# Patient Record
Sex: Male | Born: 1945 | Race: Black or African American | Hispanic: No | Marital: Single | Smoking: Never smoker
Health system: Southern US, Community
[De-identification: ages and names within clinical notes are randomized; demographics above are authoritative.]

## PROBLEM LIST (undated history)

## (undated) DIAGNOSIS — R079 Chest pain, unspecified: Secondary | ICD-10-CM

## (undated) DIAGNOSIS — E119 Type 2 diabetes mellitus without complications: Secondary | ICD-10-CM

## (undated) DIAGNOSIS — E785 Hyperlipidemia, unspecified: Secondary | ICD-10-CM

## (undated) DIAGNOSIS — I2510 Atherosclerotic heart disease of native coronary artery without angina pectoris: Secondary | ICD-10-CM

## (undated) DIAGNOSIS — I10 Essential (primary) hypertension: Secondary | ICD-10-CM

## (undated) DIAGNOSIS — G20 Parkinson's disease: Secondary | ICD-10-CM

## (undated) DIAGNOSIS — G20A1 Parkinson's disease without dyskinesia, without mention of fluctuations: Secondary | ICD-10-CM

## (undated) HISTORY — DX: Essential (primary) hypertension: I10

## (undated) HISTORY — DX: Atherosclerotic heart disease of native coronary artery without angina pectoris: I25.10

## (undated) HISTORY — DX: Parkinson's disease without dyskinesia, without mention of fluctuations: G20.A1

## (undated) HISTORY — PX: CORONARY ANGIOPLASTY WITH STENT PLACEMENT: SHX49

## (undated) HISTORY — DX: Parkinson's disease: G20

## (undated) HISTORY — DX: Type 2 diabetes mellitus without complications: E11.9

## (undated) HISTORY — DX: Hyperlipidemia, unspecified: E78.5

---

## 2015-06-28 ENCOUNTER — Encounter: Payer: Self-pay | Admitting: Neurology

## 2015-06-28 ENCOUNTER — Telehealth: Payer: Self-pay | Admitting: Neurology

## 2015-06-28 ENCOUNTER — Ambulatory Visit (INDEPENDENT_AMBULATORY_CARE_PROVIDER_SITE_OTHER): Payer: Federal, State, Local not specified - PPO | Admitting: Neurology

## 2015-06-28 VITALS — BP 140/68 | HR 84 | Ht 75.0 in | Wt 207.0 lb

## 2015-06-28 DIAGNOSIS — E1342 Other specified diabetes mellitus with diabetic polyneuropathy: Secondary | ICD-10-CM | POA: Diagnosis not present

## 2015-06-28 DIAGNOSIS — R292 Abnormal reflex: Secondary | ICD-10-CM

## 2015-06-28 DIAGNOSIS — E1142 Type 2 diabetes mellitus with diabetic polyneuropathy: Secondary | ICD-10-CM

## 2015-06-28 DIAGNOSIS — G629 Polyneuropathy, unspecified: Secondary | ICD-10-CM

## 2015-06-28 DIAGNOSIS — G20 Parkinson's disease: Secondary | ICD-10-CM

## 2015-06-28 DIAGNOSIS — K5901 Slow transit constipation: Secondary | ICD-10-CM

## 2015-06-28 DIAGNOSIS — F0280 Dementia in other diseases classified elsewhere without behavioral disturbance: Secondary | ICD-10-CM

## 2015-06-28 MED ORDER — CARBIDOPA-LEVODOPA ER 50-200 MG PO TBCR: 1.0000 | EXTENDED_RELEASE_TABLET | Freq: Every day | ORAL | Status: DC

## 2015-06-28 NOTE — Telephone Encounter (Signed)
Referral to [facility] Parkinson's [facility] PT/ST faxed to [phone] with confirmation received. They will contact patient.  

## 2015-06-28 NOTE — Progress Notes (Signed)
Gabriel Ramirez was seen today in the movement disorders clinic for neurologic consultation at the request of Lavella Hammock, MD.   The patient presents today to discuss his parkinsonism.  No one accompanies him to the visit (family is in the waiting room but he refused to let them back to the appointment).   Limited prior neurology records are available to me and the faxed copy that is available to me is virtually unreadable.  He was previously taken care of in [place].  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  Pt states that he thinks that he was dx in 2012.  He states that his first symptom was that he was "slowing down" and getting "lightheaded."  Over time, he has had some right hand tremor (rarely); he is right hand dominant.  He is on carbidopa/levodopa 25/100, 2 po tid; states that it was increased in May but cannot remember what he was taking before the increase.  He has trouble telling me if the increase helped.  He takes the medication at 9:30 am/4pm/8-9pm (goes to bed about 11 pm).  He admits that he is sleeping a lot during the day.    Specific Symptoms:  Tremor: Yes.   Voice: hypophonic Sleep: states that takes gabapentin for sleep  Vivid Dreams:  No.  Acting out dreams: "I may call out" Wet Pillows: No. Postural symptoms:  Yes.   , last PT about 1 year ago  Falls?  No. Bradykinesia symptoms: slow movements and difficulty getting out of a chair Loss of smell:  No. Loss of taste:  No. Urinary Incontinence:  No.  Constipation: yes Difficulty Swallowing:  Yes.   (only with certain pills and does well with chin tuck) Handwriting, micrographia: Yes.   Trouble with ADL's:  Yes.    Trouble buttoning clothing: Yes.   Depression:  Yes.   Memory changes:  Yes.  (doesn't drive since moved here in July) Hallucinations:  No.  visual distortions: No. N/V:  No. Lightheaded:  Yes.    Syncope:  No. Diplopia:  No. Dyskinesia:  No.  Neuroimaging has previously been performed.  It is not available for my review today.  PREVIOUS MEDICATIONS: Sinemet  ALLERGIES:  No Known Allergies  CURRENT MEDICATIONS:  Outpatient Encounter Prescriptions as of 06/28/2015  Medication Sig  . amLODipine (NORVASC) 10 MG tablet Take 10 mg by mouth daily.  Marland Kitchen atorvastatin (LIPITOR) 20 MG tablet Take 20 mg by mouth daily.  . carbidopa-levodopa (SINEMET IR) 25-100 MG tablet TAKE 2 TABLETS BY MOUTH 3 TIMES DAILY  . gabapentin (NEURONTIN) 300 MG capsule TAKE 2 CAPSULES BY MOUTH EVERY DAY AT BEDTIME  . JANUVIA 50 MG tablet Take 50 mg by mouth daily.  . quinapril (ACCUPRIL) 20 MG tablet Take 20 mg by mouth daily.   No facility-administered encounter medications on file as of 06/28/2015.    PAST MEDICAL HISTORY:   Past Medical History  Diagnosis Date  . Parkinson's disease   . Hypertension   . Diabetes     PAST SURGICAL HISTORY:   Past Surgical History  Procedure Laterality Date  . Coronary angioplasty with stent placement      SOCIAL HISTORY:   Social History   Social History  . Marital Status: Unknown    Spouse Name: N/A  . Number of Children: N/A  . Years of Education: N/A   Occupational History  . Not on file.  Social History Main Topics  . Smoking status: Never Smoker   . Smokeless tobacco: Not on file  . Alcohol Use: No  . Drug Use: No  . Sexual Activity: Not on file   Other Topics Concern  . Not on file   Social History Narrative  . No narrative on file    FAMILY HISTORY:   Family Status  Relation Status Death Age  . Mother Deceased     alzheimer's  . Father Deceased     HTN  . Sister Deceased     x3    ROS:  A complete 10 system review of systems was obtained and was unremarkable apart from what is mentioned above.  PHYSICAL EXAMINATION:    VITALS:   Filed Vitals:   06/28/15 1421  BP: 140/68  Pulse: 84  Height:  (1.905 m)  Weight: 207 lb (93.895  kg)    GEN:  The patient appears stated age and is in NAD. HEENT:  Normocephalic, atraumatic.  The mucous membranes are moist. The superficial temporal arteries are without ropiness or tenderness. CV:  RRR Lungs:  CTAB Neck/HEME:  There are no carotid bruits bilaterally.  Neurological examination:  Orientation:  Montreal Cognitive Assessment  06/28/2015  Visuospatial/ Executive (0/5) 1  Naming (0/3) 2  Attention: Read list of digits (0/2) 1  Attention: Read list of letters (0/1) 1  Attention: Serial 7 subtraction starting at 100 (0/3) 3  Language: Repeat phrase (0/2) 2  Language : Fluency (0/1) 1  Abstraction (0/2) 2  Delayed Recall (0/5) 0  Orientation (0/6) 5  Total 18  Adjusted Score (based on education) 19   Cranial nerves: There is good facial symmetry. There is facial hypomimia.  Pupils are equal round and reactive to light bilaterally. Fundoscopic exam reveals clear margins bilaterally. Extraocular muscles are intact. There is poor smooth pursuit.  The visual fields are full to confrontational testing. The speech is fluent and clear.  He is very hypophonic.   Soft palate rises symmetrically and there is no tongue deviation. Hearing is intact to conversational tone. Sensation: Sensation is intact to light and pinprick throughout (facial, trunk, extremities). Vibration is markedly decreased distally. There is no extinction with double simultaneous stimulation. There is no sensory dermatomal level identified. Motor: Strength is 5/5 in the bilateral upper and lower extremities.   Shoulder shrug is equal and symmetric.  There is no pronator drift. Deep tendon reflexes: Deep tendon reflexes are 2+/4 at the bilateral biceps, triceps, brachioradialis, patella and 2/4 at the bilateral achilles. Plantar responses are downgoing bilaterally.  Movement examination: Tone: There is marked increased tone in the RUE/RLE and mild-mod increased tone in LUE. Abnormal movements: none even with  distraction Coordination:  There is decremation with RAM's, with all form of RAMS, including alternating supination and pronation of the forearm, hand opening and closing, finger taps, heel taps and toe taps, right more than left and UE more than LE. Gait and Station: The patient has difficulty arising out of a deep-seated chair without the use of the hands; in fact, he cannot do it without pushing off. The patient's stride length is decreased with decreased arm swing bilaterally.  The patient has a negative pull test.      ASSESSMENT/PLAN:  1.  Parkinsonism.  I suspect that this does represent idiopathic Parkinson's disease.  The patient has tremor, bradykinesia, rigidity and postural instability.  -We discussed the diagnosis as well as pathophysiology of the disease.  We discussed treatment options  as well as prognostic indicators.  Patient education was provided.  -discussed with him that he does NOT have lewy body disease and that lewy body disease is a separate disease from PD even though we see "lewy bodies" pathologically in both diseases  -He looks undertreated, but has not taken his medications since 9 AM this morning and was seen at 2:30 PM.  While I asked him to continue his carbidopa/levodopa 25/100, 2 tablets 3 times per day I also asked him to move the dosages closer together so that he was taking 2 tablets at 9 AM/1 PM/5-6:00 PM and then we will add carbidopa/levodopa 50/200 CR at bedtime  -I will refer the patient to the Parkinson's program at [facility] for physical and speech therapy  -We discussed community resources in the area including patient support groups and community exercise programs for PD and pt education was provided to the patient.  -We talked about the importance of safe, cardiovascular exercise. 2.  Parkinson's disease dementia  -Overall, this is fairly mild.  He asked me about driving and while I am concerned about memory, I really am more concerned about the slowing  of reflexes.  He wants to start driving to the store, but I told him that I would recommend a medical driving evaluation before he did this.  I gave him the number to an independent medical evaluator and he can call to set this up at his convenience 3.  Constipation  -This is frequently associated with Parkinson's disease.  I did give him a copy of the rancho recipe. 4.  Hyperreflexia  -He is mildly hyperreflexic, which is somewhat surprising given diabetic peripheral neuropathy.  However, he denies any neck pain and reports that he has had multiple previous scans of the brain.  I will try to get a copy of prior records from [place] from his previous neurologist.  There is nothing focal or lateralizing on his examination. 5.  Diabetic peripheral neuropathy  -Safety was discussed.  -The patient is on gabapentin, 600 mg at night.  He tells me that this is primarily for sleep.  We decided not to change this today, since we are changing other things.  We may wean that in the future. 6.  Follow-up in the next 2-3 months, sooner should new neurologic issues arise.  Greater than 50% of the 60 minute visit spent in counseling.

## 2015-06-28 NOTE — Patient Instructions (Addendum)
1. Constipation and Parkinson's disease:   1.Rancho recipe for constipation in Parkinsons Disease:  -1 cup of bran, 2 cups of applesauce in 1 cup of prune juice  2.  Increase fiber intake (Metamucil,vegetables)  3.  Regular, moderate exercise can be beneficial.  4.  Avoid medications causing constipation, such as medications like antacids with calcium or magnesium  5.  Laxative overuse should be avoided.  6.  Stool softeners (Colace) can help with chronic constipation.  2. If you are interested in the driving assessment, you can contact [employer] in [place]. [phone]. 3. Continue Gabapentin 4. We have referred you to [facility] for physical and speech therapy. They will contact you directly to set up care. They can be reached at [phone]. 5. Move your Carbidopa Levodopa IR doses to 9 am/ 1 pm/ 5 pm and start Carbidopa Levodopa 50/200 at night. This prescription has been sent to your pharmacy.  6. Follow up in 8 weeks.

## 2015-06-29 NOTE — Progress Notes (Signed)
Note routed to Dr Mitchell.  

## 2015-08-30 ENCOUNTER — Encounter: Payer: Self-pay | Admitting: Neurology

## 2015-08-30 ENCOUNTER — Ambulatory Visit (INDEPENDENT_AMBULATORY_CARE_PROVIDER_SITE_OTHER): Payer: Federal, State, Local not specified - PPO | Admitting: Neurology

## 2015-08-30 VITALS — BP 138/84 | HR 80 | Ht 75.0 in | Wt 213.0 lb

## 2015-08-30 DIAGNOSIS — G20 Parkinson's disease: Secondary | ICD-10-CM | POA: Diagnosis not present

## 2015-08-30 DIAGNOSIS — F0280 Dementia in other diseases classified elsewhere without behavioral disturbance: Secondary | ICD-10-CM

## 2015-08-30 DIAGNOSIS — R55 Syncope and collapse: Secondary | ICD-10-CM | POA: Diagnosis not present

## 2015-08-30 DIAGNOSIS — Z955 Presence of coronary angioplasty implant and graft: Secondary | ICD-10-CM

## 2015-08-30 MED ORDER — CARBIDOPA-LEVODOPA 25-100 MG PO TABS: ORAL_TABLET | ORAL | Status: DC

## 2015-08-30 NOTE — Progress Notes (Signed)
Gabriel Ramirez was seen today in the movement disorders clinic for neurologic consultation at the request of Lavella HammockMitchell, Kyle Darren, MD.   The patient presents today to discuss his parkinsonism.  No one accompanies him to the visit (family is in the waiting room but he refused to let them back to the appointment).   Limited prior neurology records are available to me and the faxed copy that is available to me is virtually unreadable.  He was previously taken care of in [place].  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  Pt states that he thinks that he was dx in 2012.  He states that his first symptom was that he was "slowing down" and getting "lightheaded."  Over time, he has had some right hand tremor (rarely); he is right hand dominant.  He is on carbidopa/levodopa 25/100, 2 po tid; states that it was increased in May but cannot remember what he was taking before the increase.  He has trouble telling me if the increase helped.  He takes the medication at 9:30 am/4pm/8-9pm (goes to bed about 11 pm).  He admits that he is sleeping a lot during the day.    08/30/15 update:  The patient returns today for follow-up.  He is on carbidopa/levodopa 25/100, 2 tablets 3 times per day (9 AM/1 PM/6 p.m.) and last visit we added carbidopa/levodopa 50/200 at night.  He states that it helped morning stiffness.  I referred [facility] for [facility] speech therapy.   The therapy has helped.  Since our last visit, the patient denies any falls.  He denies hallucinations.    I was able to get a copy of his MRI of the brain since our last visit, but it was a very difficult to read fax.  I am unsure if it was dated 12/17/2013 or 12/18/2014.  Regardless, the report stated that it showed chronic microvascular changes and an old lacune in the right frontal region.  I have tried multiple times to get records from [place] and no records were sent.   He has not been exercising.  He initially reports headache, but upon further questioning he actually has no head pain at all and is trying to tell me that he is having near syncopal episodes after he eats lunch every day.  He tells me that this is the same feeling that he had before he had to have his cardiac stent placed.  He had a cardiologist when he lived in [place], but has not yet established here.  He denies any chest pain.  He states that he does not think that the episodes are associated with taking his noon medication as he had these episodes years ago before he started taking levodopa.  He also c/o constipation.  Neuroimaging has previously been performed.  It is not available for my review today.  PREVIOUS MEDICATIONS: Sinemet  ALLERGIES:  No Known Allergies  CURRENT MEDICATIONS:  Outpatient Encounter Prescriptions as of 08/30/2015  Medication Sig  . amLODipine (NORVASC) 10 MG tablet Take 10 mg by mouth daily.  Marland Kitchen. atorvastatin (LIPITOR) 20 MG tablet Take 20 mg by mouth daily.  . carbidopa-levodopa (SINEMET CR) 50-200 MG tablet Take 1 tablet by mouth at bedtime.  . carbidopa-levodopa (SINEMET IR) 25-100 MG tablet TAKE 2 TABLETS BY MOUTH 3 TIMES DAILY  . gabapentin (NEURONTIN) 300 MG capsule TAKE 2 CAPSULES BY MOUTH EVERY DAY  AT BEDTIME  . JANUVIA 50 MG tablet Take 50 mg by mouth daily.  . quinapril (ACCUPRIL) 20 MG tablet Take 20 mg by mouth daily.  Marland Kitchen zolpidem (AMBIEN) 10 MG tablet Take 10 mg by mouth at bedtime as needed for sleep.   No facility-administered encounter medications on file as of 08/30/2015.    PAST MEDICAL HISTORY:   Past Medical History  Diagnosis Date  . Parkinson's disease   . Hypertension   . Diabetes     PAST SURGICAL HISTORY:   Past Surgical History  Procedure Laterality Date  . Coronary angioplasty with stent placement      SOCIAL HISTORY:   Social History   Social History  . Marital Status: Unknown    Spouse Name: N/A  . Number of Children:  N/A  . Years of Education: N/A   Occupational History  . Not on file.   Social History Main Topics  . Smoking status: Never Smoker   . Smokeless tobacco: Not on file  . Alcohol Use: No  . Drug Use: No  . Sexual Activity: Not on file   Other Topics Concern  . Not on file   Social History Narrative  . No narrative on file    FAMILY HISTORY:   Family Status  Relation Status Death Age  . Mother Deceased     alzheimer's  . Father Deceased     HTN  . Sister Deceased     x3    ROS:  A complete 10 system review of systems was obtained and was unremarkable apart from what is mentioned above.  PHYSICAL EXAMINATION:    VITALS:   Filed Vitals:   08/30/15 1454  BP: 138/84  Pulse: 80  Height:  (1.905 m)  Weight: 213 lb (96.616 kg)    GEN:  The patient appears stated age and is in NAD. HEENT:  Normocephalic, atraumatic.  The mucous membranes are moist. The superficial temporal arteries are without ropiness or tenderness. CV:  RRR Lungs:  CTAB Neck/HEME:  There are no carotid bruits bilaterally.  Neurological examination:  Orientation:  Montreal Cognitive Assessment  06/28/2015  Visuospatial/ Executive (0/5) 1  Naming (0/3) 2  Attention: Read list of digits (0/2) 1  Attention: Read list of letters (0/1) 1  Attention: Serial 7 subtraction starting at 100 (0/3) 3  Language: Repeat phrase (0/2) 2  Language : Fluency (0/1) 1  Abstraction (0/2) 2  Delayed Recall (0/5) 0  Orientation (0/6) 5  Total 18  Adjusted Score (based on education) 19   Cranial nerves: There is good facial symmetry. There is facial hypomimia.  Pupils are equal round and reactive to light bilaterally. Fundoscopic exam reveals clear margins bilaterally. Extraocular muscles are intact. There is poor smooth pursuit.  The visual fields are full to confrontational testing. The speech is fluent and clear.  He is very hypophonic.   Soft palate rises symmetrically and there is no tongue deviation.  Hearing is intact to conversational tone. Sensation: Sensation is intact to light touch throughout. Motor: Strength is 5/5 in the bilateral upper and lower extremities.   Shoulder shrug is equal and symmetric.  There is no pronator drift.   Movement examination: Tone: There is moderate increased tone bilaterally today. Abnormal movements: none even with distraction Coordination:  There is decremation with RAM's, with all form of RAMS, including alternating supination and pronation of the forearm, hand opening and closing, finger taps, heel taps and toe taps, right more than left and  UE more than LE. Gait and Station: The patient has difficulty arising out of a deep-seated chair without the use of the hands; in fact, he cannot do it without pushing off. The patient has start hesitation.  The patient has a negative pull test.      ASSESSMENT/PLAN:  1.  Parkinsonism.  I suspect that this does represent idiopathic Parkinson's disease.  The patient has tremor, bradykinesia, rigidity and postural instability.  -I am going to increase his carbidopa/levodopa 25/100, 2 tablets at 9 AM/noon/3 PM and one tablet at 7 PM.  He will add carbidopa/levodopa 50/200 at bedtime.  I am hoping that he is able to manage these medications by himself, as he tells me that he is administering them to himself. 2.  Parkinson's disease dementia  -He is living at assisted living.  Again, there is some concern on my part about management of medication, although he did seem to know his medications today and brought them in with him today. 3.  Constipation  -This is frequently associated with Parkinson's disease.  I did give him a copy of the rancho recipe again today. 4.  Near syncope  -Although he had trouble initially describing this, and actually called this headache until I figured out that he actually had no head pain and ultimately he was able to tell me that this was the same feeling he had before he had his cardiac stent  placed.  I will refer him to cardiology. 5.  Diabetic peripheral neuropathy  -Safety was discussed.  -The patient is on gabapentin, 600 mg at night.  He tells me that this is primarily for sleep.  We decided not to change this today, since we are changing other things.  We may wean that in the future. 6.  Follow-up in the next 2-3 months, sooner should new neurologic issues arise.

## 2015-08-30 NOTE — Patient Instructions (Addendum)
Constipation and Parkinson's disease:   1.Rancho recipe for constipation in Parkinsons Disease:  -1 cup of bran (get this at [employer], [employer] or other natural food store), 2 cups of applesauce in 1 cup of prune juice  2.  Increase fiber intake (Metamucil,vegetables)  3.  Regular, moderate exercise can be beneficial.  4.  Avoid medications causing constipation, such as medications like antacids with calcium or magnesium  5.  Laxative overuse should be avoided.  6.  Stool softeners (Colace) can help with chronic constipation.  Change Carbidopa Levodopa 25/100 to 2 tablets at 9 am. 2 tablets at 12 pm. 2 tablets at 3 pm. 1 tablet at 7 pm. A new prescription has been sent. Continue Carbidopa Levodopa 50/200 - 1 tablet at bedtime.   Cardiology appt scheduled with Dr. Jens Somrenshaw 08/31/2015 at 11:15 am.

## 2015-08-31 ENCOUNTER — Encounter: Payer: Self-pay | Admitting: Cardiology

## 2015-08-31 ENCOUNTER — Ambulatory Visit (INDEPENDENT_AMBULATORY_CARE_PROVIDER_SITE_OTHER): Payer: Federal, State, Local not specified - PPO | Admitting: Cardiology

## 2015-08-31 VITALS — BP 134/66 | HR 80 | Ht 75.0 in | Wt 213.6 lb

## 2015-08-31 DIAGNOSIS — E785 Hyperlipidemia, unspecified: Secondary | ICD-10-CM

## 2015-08-31 DIAGNOSIS — G20A1 Parkinson's disease without dyskinesia, without mention of fluctuations: Secondary | ICD-10-CM

## 2015-08-31 DIAGNOSIS — I2583 Coronary atherosclerosis due to lipid rich plaque: Secondary | ICD-10-CM

## 2015-08-31 DIAGNOSIS — I10 Essential (primary) hypertension: Secondary | ICD-10-CM

## 2015-08-31 DIAGNOSIS — G20 Parkinson's disease: Secondary | ICD-10-CM | POA: Diagnosis not present

## 2015-08-31 DIAGNOSIS — I2510 Atherosclerotic heart disease of native coronary artery without angina pectoris: Secondary | ICD-10-CM

## 2015-08-31 NOTE — Assessment & Plan Note (Signed)
Blood pressure controlled. Continue present medications. 

## 2015-08-31 NOTE — Assessment & Plan Note (Signed)
Continue statin. 

## 2015-08-31 NOTE — Assessment & Plan Note (Signed)
Continue aspirin and statin. We will obtain previous records from [place] concerning his previous PCI.

## 2015-08-31 NOTE — Assessment & Plan Note (Signed)
Management per neurology. Note he occasionally has orthostatic symptoms that are not particularly limiting. If they worsen we will decrease his blood pressure medications in the future.

## 2015-08-31 NOTE — Progress Notes (Signed)
Note routed to Dr Clovis RileyMitchell.

## 2015-08-31 NOTE — Progress Notes (Signed)
     HPI: 69 year old male for evaluation of CAD. Patient previously lived in [place]. He has had previous PCI of his first diagonal. Full records are not available. He recently moved to this area to be with family. He denies significant dyspnea, chest pain, palpitations, pedal edema or syncope. He occasionally has some dizziness with standing. Note he also has Parkinson's.  Current Outpatient Prescriptions  Medication Sig Dispense Refill  . amLODipine (NORVASC) 10 MG tablet Take 10 mg by mouth daily.  3  . atorvastatin (LIPITOR) 20 MG tablet Take 20 mg by mouth daily.  3  . carbidopa-levodopa (SINEMET CR) 50-200 MG tablet Take 1 tablet by mouth at bedtime. 90 tablet 1  . carbidopa-levodopa (SINEMET IR) 25-100 MG tablet Take 2 tablets at 9 am. 2 tablets at 12 pm. 2 tablets at 3 pm. 1 tablet at 7 pm. 210 tablet 5  . gabapentin (NEURONTIN) 300 MG capsule TAKE 2 CAPSULES BY MOUTH EVERY DAY AT BEDTIME  3  . JANUVIA 50 MG tablet Take 50 mg by mouth daily.  3  . quinapril (ACCUPRIL) 20 MG tablet Take 20 mg by mouth daily.  3  . zolpidem (AMBIEN) 10 MG tablet Take 10 mg by mouth at bedtime as needed for sleep.     No current facility-administered medications for this visit.    No Known Allergies   Past Medical History  Diagnosis Date  . Parkinson's disease (HCC)   . Hypertension   . Diabetes (HCC)   . Hyperlipidemia   . CAD (coronary artery disease)     Stent in [place]; First diagonal    Past Surgical History  Procedure Laterality Date  . Coronary angioplasty with stent placement      Social History   Social History  . Marital Status: Single    Spouse Name: N/A  . Number of Children: N/A  . Years of Education: N/A   Occupational History  . Not on file.   Social History Main Topics  . Smoking status: Never Smoker   . Smokeless tobacco: Not on file  . Alcohol Use: No  . Drug Use: No  . Sexual Activity: Not on file   Other Topics Concern  . Not on file    Social History Narrative    Family History  Problem Relation Age of Onset  . Alzheimer's disease Mother   . Hypertension Father     ROS: no fevers or chills, productive cough, hemoptysis, dysphasia, odynophagia, melena, hematochezia, dysuria, hematuria, rash, seizure activity, orthopnea, PND, pedal edema, claudication. Remaining systems are negative.  Physical Exam:   Blood pressure 134/66, pulse 80, height 6\' 3"  (1.905 m), weight 213 lb 9.6 oz (96.888 kg).  General:  Well developed/well nourished in NAD Skin warm/dry Patient not depressed No peripheral clubbing Back-normal HEENT-normal/normal eyelids Neck supple/normal carotid upstroke bilaterally; no bruits; no JVD; no thyromegaly chest - CTA/ normal expansion CV - RRR/normal S1 and S2; no murmurs, rubs or gallops;  PMI nondisplaced Abdomen -NT/ND, no HSM, no mass, + bowel sounds, no bruit 2+ femoral pulses, no bruits Ext-no edema, chords, 2+ DP Neuro-flat affect; consistent with Parkinson's  ECG Sinus rhythm at a rate of 80. Normal axis. Nonspecific T-wave changes.

## 2015-08-31 NOTE — Patient Instructions (Signed)
Your physician wants you to follow-up in: 6 MONTHS WITH DR CRENSHAW You will receive a reminder letter in the mail two months in advance. If you don't receive a letter, please call our office to schedule the follow-up appointment.   If you need a refill on your cardiac medications before your next appointment, please call your pharmacy.  

## 2015-10-25 ENCOUNTER — Encounter: Payer: Self-pay | Admitting: Neurology

## 2015-10-25 ENCOUNTER — Ambulatory Visit (INDEPENDENT_AMBULATORY_CARE_PROVIDER_SITE_OTHER): Payer: Federal, State, Local not specified - PPO | Admitting: Neurology

## 2015-10-25 VITALS — BP 100/58 | HR 68 | Ht 75.0 in | Wt 212.0 lb

## 2015-10-25 DIAGNOSIS — F0280 Dementia in other diseases classified elsewhere without behavioral disturbance: Secondary | ICD-10-CM

## 2015-10-25 DIAGNOSIS — R55 Syncope and collapse: Secondary | ICD-10-CM

## 2015-10-25 DIAGNOSIS — G20 Parkinson's disease: Secondary | ICD-10-CM | POA: Diagnosis not present

## 2015-10-25 NOTE — Progress Notes (Signed)
Gabriel Ramirez was seen today in the movement disorders clinic for neurologic consultation at the request of Lavella Hammock, MD.   The patient presents today to discuss his parkinsonism.  No one accompanies him to the visit (family is in the waiting room but he refused to let them back to the appointment).   Limited prior neurology records are available to me and the faxed copy that is available to me is virtually unreadable.  He was previously taken care of in [place].  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  Pt states that he thinks that he was dx in 2012.  He states that his first symptom was that he was "slowing down" and getting "lightheaded."  Over time, he has had some right hand tremor (rarely); he is right hand dominant.  He is on carbidopa/levodopa 25/100, 2 po tid; states that it was increased in May but cannot remember what he was taking before the increase.  He has trouble telling me if the increase helped.  He takes the medication at 9:30 am/4pm/8-9pm (goes to bed about 11 pm).  He admits that he is sleeping a lot during the day.    08/30/15 update:  The patient returns today for follow-up.  He is on carbidopa/levodopa 25/100, 2 tablets 3 times per day (9 AM/1 PM/6 p.m.) and last visit we added carbidopa/levodopa 50/200 at night.  He states that it helped morning stiffness.  I referred him to [facility] for home physical and speech therapy.   The therapy has helped.  Since our last visit, the patient denies any falls.  He denies hallucinations.    I was able to get a copy of his MRI of the brain since our last visit, but it was a very difficult to read fax.  I am unsure if it was dated 12/17/2013 or 12/18/2014.  Regardless, the report stated that it showed chronic microvascular changes and an old lacune in the right frontal region.  I have tried multiple times to get records from [place] and no records were sent.   He has not been exercising.  He initially reports headache, but upon further questioning he actually has no head pain at all and is trying to tell me that he is having near syncopal episodes after he eats lunch every day.  He tells me that this is the same feeling that he had before he had to have his cardiac stent placed.  He had a cardiologist when he lived in [place], but has not yet established here.  He denies any chest pain.  He states that he does not think that the episodes are associated with taking his noon medication as he had these episodes years ago before he started taking levodopa.  He also c/o constipation.  10/25/15 update:  The patient is following up today.  Last visit, his levodopa was slightly increased.  He is supposed to be taking carbidopa/levodopa 25/100, 2 tablets at 9 AM, noon, 3 PM, and one tablet at 7 PM in addition to carbidopa/levodopa 50/200 at night.  He did not add that one at 7 last visit like he was supposed to.  He doesn't remember [place] talking about it.  He has seen cardiology since our last visit.  I reviewed Dr. Ludwig Clarks records.  He is taking a wait and see approach in deciding on whether or not to decrease his blood pressure medications.  The patient has described some lightheadedness, but he has not had any syncopal episodes.  He is on gabapentin, 600 mg at night for diabetic peripheral neuropathy.  He c/o nausea today.  It doesn't seem to come at any particular time.  He states that it used to be "every once in a while" but now it is more regular.      Neuroimaging has previously been performed.  It is not available for my review today.  PREVIOUS MEDICATIONS: Sinemet  ALLERGIES:  No Known Allergies  CURRENT MEDICATIONS:  Outpatient Encounter Prescriptions as of 10/25/2015  Medication Sig  . amLODipine (NORVASC) 10 MG tablet Take 10 mg by mouth daily.  Marland Kitchen atorvastatin (LIPITOR) 20 MG tablet Take 20 mg by mouth daily.  . carbidopa-levodopa (SINEMET CR) 50-200 MG  tablet Take 1 tablet by mouth at bedtime.  . carbidopa-levodopa (SINEMET IR) 25-100 MG tablet Take 2 tablets at 9 am. 2 tablets at 12 pm. 2 tablets at 3 pm. 1 tablet at 7 pm. (Patient taking differently: Take 2 tablets at 8 am. 2 tablets at 12 pm. 2 tablets at 4:30 pm.)  . gabapentin (NEURONTIN) 300 MG capsule TAKE 2 CAPSULES BY MOUTH EVERY DAY AT BEDTIME  . JANUVIA 50 MG tablet Take 50 mg by mouth daily.  . quinapril (ACCUPRIL) 20 MG tablet Take 20 mg by mouth daily.  . [DISCONTINUED] zolpidem (AMBIEN) 10 MG tablet Take 10 mg by mouth at bedtime as needed for sleep.   No facility-administered encounter medications on file as of 10/25/2015.    PAST MEDICAL HISTORY:   Past Medical History  Diagnosis Date  . Parkinson's disease (HCC)   . Hypertension   . Diabetes (HCC)   . Hyperlipidemia   . CAD (coronary artery disease)     Stent in [place]; First diagonal    PAST SURGICAL HISTORY:   Past Surgical History  Procedure Laterality Date  . Coronary angioplasty with stent placement      SOCIAL HISTORY:   Social History   Social History  . Marital Status: Single    Spouse Name: N/A  . Number of Children: N/A  . Years of Education: N/A   Occupational History  . Not on file.   Social History Main Topics  . Smoking status: Never Smoker   . Smokeless tobacco: Not on file  . Alcohol Use: No  . Drug Use: No  . Sexual Activity: Not on file   Other Topics Concern  . Not on file   Social History Narrative    FAMILY HISTORY:   Family Status  Relation Status Death Age  . Mother Deceased     alzheimer's  . Father Deceased     HTN  . Sister Deceased     x3    ROS:  A complete 10 system review of systems was obtained and was unremarkable apart from what is mentioned above.  PHYSICAL EXAMINATION:    VITALS:   Filed Vitals:   10/25/15 1408  BP: 100/58  Pulse: 68  Height:  (1.905 m)  Weight: 212 lb (96.163 kg)    GEN:  The patient appears stated age and is in  NAD. HEENT:  Normocephalic, atraumatic.  The mucous membranes are moist. The superficial temporal arteries are without ropiness or tenderness. CV:  RRR Lungs:  CTAB Neck/HEME:  There are no carotid bruits bilaterally.  Neurological examination:  Orientation:  Montreal Cognitive Assessment  06/28/2015  Visuospatial/ Executive (0/5) 1  Naming (0/3) 2  Attention: Read list of digits (0/2) 1  Attention: Read list of letters (0/1) 1  Attention: Serial 7 subtraction starting at 100 (0/3) 3  Language: Repeat phrase (0/2) 2  Language : Fluency (0/1) 1  Abstraction (0/2) 2  Delayed Recall (0/5) 0  Orientation (0/6) 5  Total 18  Adjusted Score (based on education) 19   Cranial nerves: There is good facial symmetry. There is facial hypomimia.  Pupils are equal round and reactive to light bilaterally. Fundoscopic exam reveals clear margins bilaterally. Extraocular muscles are intact. There is poor smooth pursuit.  The visual fields are full to confrontational testing. The speech is fluent and clear.  He is very hypophonic.   Soft palate rises symmetrically and there is no tongue deviation. Hearing is intact to conversational tone. Sensation: Sensation is intact to light touch throughout. Motor: Strength is 5/5 in the bilateral upper and lower extremities.   Shoulder shrug is equal and symmetric.  There is no pronator drift.   Movement examination: Tone: There is moderate increased tone bilaterally today in the UE (some gegenhalten as well).  Tone in the LE is normal Abnormal movements: mild dyskinesia, mostly in the legs Coordination:  There is decremation with RAM's, with all form of RAMS, including alternating supination and pronation of the forearm, hand opening and closing, finger taps, heel taps and toe taps, right more than left and UE more than LE. Gait and Station: The patient has difficulty arising out of a deep-seated chair without the use of the hands; in fact, he cannot do it without  pushing off. The patient has start hesitation.  He walks well with just slightly decreased arm swing on the right.    ASSESSMENT/PLAN:  1.  Parkinsonism.  I suspect that this does represent idiopathic Parkinson's disease.  The patient has tremor, bradykinesia, rigidity and postural instability.  -He is supposed to be on carbidopa/levodopa 25/100, 2 tablets at 9 AM/noon/3 PM and one tablet at 7 PM but he never took the 7pm dose and doesn't really want to change it, even though he is moderately rigid.  He will continue carbidopa/levodopa 50/200 at bedtime.   -encouraged pt to add CV exercise.  Has stationary bike where he lives.   2.  Parkinson's disease dementia  -He is living at assisted living.  I would like to see them manage meds but he is doing that for now. 3.  Constipation  -This is frequently associated with Parkinson's disease.  I did give him a copy of the rancho recipe again today. 4.  Near syncope  -seems to be doing well in this regard.   5.  Diabetic peripheral neuropathy  -Safety was discussed.  -The patient is on gabapentin, 600 mg at night.  He tells me that this is primarily for sleep.  We decided not to change this today, since we are changing other things.  We may wean that in the future. 6.  Follow-up in the next 2-3 months, sooner should new neurologic issues arise.

## 2016-01-03 ENCOUNTER — Other Ambulatory Visit: Payer: Self-pay | Admitting: Neurology

## 2016-01-04 NOTE — Telephone Encounter (Signed)
Carbidopa Levodopa 50/200 refill requested. Per last office note- patient to remain on medication. Refill approved and sent to patient's pharmacy.   

## 2016-02-26 ENCOUNTER — Ambulatory Visit: Payer: Medicare Other | Admitting: Neurology

## 2016-03-28 ENCOUNTER — Ambulatory Visit (INDEPENDENT_AMBULATORY_CARE_PROVIDER_SITE_OTHER): Payer: Federal, State, Local not specified - PPO | Admitting: Neurology

## 2016-03-28 ENCOUNTER — Encounter: Payer: Self-pay | Admitting: Neurology

## 2016-03-28 VITALS — BP 130/70 | HR 77 | Ht 75.0 in | Wt 214.4 lb

## 2016-03-28 DIAGNOSIS — E1142 Type 2 diabetes mellitus with diabetic polyneuropathy: Secondary | ICD-10-CM

## 2016-03-28 DIAGNOSIS — G44209 Tension-type headache, unspecified, not intractable: Secondary | ICD-10-CM

## 2016-03-28 DIAGNOSIS — G20 Parkinson's disease: Secondary | ICD-10-CM | POA: Diagnosis not present

## 2016-03-28 NOTE — Progress Notes (Signed)
Gabriel Ramirez was seen today in the movement disorders clinic for neurologic consultation at the request of Lavella Hammock, MD.   The patient presents today to discuss his parkinsonism.  No one accompanies him to the visit (family is in the waiting room but he refused to let them back to the appointment).   Limited prior neurology records are available to me and the faxed copy that is available to me is virtually unreadable.  He was previously taken care of in [place].  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  Pt states that he thinks that he was dx in 2012.  He states that his first symptom was that he was "slowing down" and getting "lightheaded."  Over time, he has had some right hand tremor (rarely); he is right hand dominant.  He is on carbidopa/levodopa 25/100, 2 po tid; states that it was increased in May but cannot remember what he was taking before the increase.  He has trouble telling me if the increase helped.  He takes the medication at 9:30 am/4pm/8-9pm (goes to bed about 11 pm).  He admits that he is sleeping a lot during the day.    08/30/15 update:  The patient returns today for follow-up.  He is on carbidopa/levodopa 25/100, 2 tablets 3 times per day (9 AM/1 PM/6 p.m.) and last visit we added carbidopa/levodopa 50/200 at night.  He states that it helped morning stiffness.  I referred him to [facility] for home physical and speech therapy.   The therapy has helped.  Since our last visit, the patient denies any falls.  He denies hallucinations.    I was able to get a copy of his MRI of the brain since our last visit, but it was a very difficult to read fax.  I am unsure if it was dated 12/17/2013 or 12/18/2014.  Regardless, the report stated that it showed chronic microvascular changes and an old lacune in the right frontal region.  I have tried multiple times to get records from [place] and no records were sent.   He has not been exercising.  He initially reports headache, but upon further questioning he actually has no head pain at all and is trying to tell me that he is having near syncopal episodes after he eats lunch every day.  He tells me that this is the same feeling that he had before he had to have his cardiac stent placed.  He had a cardiologist when he lived in [place], but has not yet established here.  He denies any chest pain.  He states that he does not think that the episodes are associated with taking his noon medication as he had these episodes years ago before he started taking levodopa.  He also c/o constipation.  10/25/15 update:  The patient is following up today.  Last visit, his levodopa was slightly increased.  He is supposed to be taking carbidopa/levodopa 25/100, 2 tablets at 9 AM, noon, 3 PM, and one tablet at 7 PM in addition to carbidopa/levodopa 50/200 at night.  He did not add that one at 7 last visit like he was supposed to.  He doesn't remember [place] talking about it.  He has seen cardiology since our last visit.  I reviewed Dr. Ludwig Clarks records.  He is taking a wait and see approach in deciding on whether or not to decrease his blood pressure medications.  The patient has described some lightheadedness, but he has not had any syncopal episodes.  He is on gabapentin, 600 mg at night for diabetic peripheral neuropathy.  He c/o nausea today.  It doesn't seem to come at any particular time.  He states that it used to be "every once in a while" but now it is more regular.      03/28/16 update:  The patient is following up today.  He is taking carbidopa/levodopa 25/100, 2 tablets at 9 AM, noon, 3 PM, and carbidopa/levodopa 50/200 at night.  A higher dosage has been recommended, but he really has not wanted to go up on the medication.  He is riding stationary bike 2-3 times a week.   He denies any falls since last visit.  The patient has described some lightheadedness, but he has not had any  syncopal episodes.  He is on gabapentin, 600 mg at night for diabetic peripheral neuropathy.  Having headaches, mostly facial.  Coming daily.  Cannot describe quality to me.  May take advil to relieve and does that one time a day.  Has no photophobia and no phonophobia.  Been going on about a month  Neuroimaging has previously been performed.  It is not available for my review today.  PREVIOUS MEDICATIONS: Sinemet  ALLERGIES:  No Known Allergies  CURRENT MEDICATIONS:  Outpatient Encounter Prescriptions as of 03/28/2016  Medication Sig  . amLODipine (NORVASC) 10 MG tablet Take 10 mg by mouth daily.  Marland Kitchen. atorvastatin (LIPITOR) 20 MG tablet Take 20 mg by mouth daily.  . carbidopa-levodopa (SINEMET CR) 50-200 MG tablet TAKE 1 TABLET BY MOUTH AT BEDTIME.  . carbidopa-levodopa (SINEMET IR) 25-100 MG tablet Take 2 tablets at 9 am. 2 tablets at 12 pm. 2 tablets at 3 pm. 1 tablet at 7 pm. (Patient taking differently: Take 2 tablets at 8 am. 2 tablets at 12 pm. 2 tablets at 4:30 pm.)  . colchicine 0.6 MG tablet TAKE 1 TABLET EVERY 8 HOURS UNTIL GOUT ATTACK STOPS  . gabapentin (NEURONTIN) 300 MG capsule TAKE 2 CAPSULES BY MOUTH EVERY DAY AT BEDTIME  . JANUVIA 50 MG tablet Take 50 mg by mouth daily.  Letta Pate. ONETOUCH DELICA LANCETS 33G MISC USE AS DIRECTED ONCE DAILY TO TEST BLOOD SUGAR  . ONETOUCH VERIO test strip USE TO CHECK CBG EVERY AM, ALTERNATING BETWEEN AM AND PM MEALS  . quinapril (ACCUPRIL) 20 MG tablet Take 20 mg by mouth daily.   No facility-administered encounter medications on file as of 03/28/2016.    PAST MEDICAL HISTORY:   Past Medical History  Diagnosis Date  . Parkinson's disease (HCC)   . Hypertension   . Diabetes (HCC)   . Hyperlipidemia   . CAD (coronary artery disease)     Stent in [place]; First diagonal    PAST SURGICAL HISTORY:   Past Surgical History  Procedure Laterality Date  . Coronary angioplasty with stent placement      SOCIAL HISTORY:   Social History    Social History  . Marital Status: Single    Spouse Name: N/A  . Number of Children: N/A  . Years of Education: N/A   Occupational History  . Not on file.   Social History Main Topics  . Smoking status: Never Smoker   . Smokeless tobacco: Not on file  . Alcohol Use: No  . Drug Use: No  . Sexual Activity: Not on file   Other Topics Concern  . Not on file   Social  History Narrative    FAMILY HISTORY:   Family Status  Relation Status Death Age  . Mother Deceased     alzheimer's  . Father Deceased     HTN  . Sister Deceased     x3    ROS:  A complete 10 system review of systems was obtained and was unremarkable apart from what is mentioned above.  PHYSICAL EXAMINATION:    VITALS:   Filed Vitals:   03/28/16 1500  BP: 130/70  Pulse: 77  Height:  (1.905 m)  Weight: 214 lb 6 oz (97.24 kg)  SpO2: 97%    GEN:  The patient appears stated age and is in NAD. HEENT:  Normocephalic, atraumatic.  The mucous membranes are moist. The superficial temporal arteries are without ropiness or tenderness. CV:  RRR Lungs:  CTAB Neck/HEME:  There are no carotid bruits bilaterally.  Neurological examination:  Orientation:  Montreal Cognitive Assessment  06/28/2015  Visuospatial/ Executive (0/5) 1  Naming (0/3) 2  Attention: Read list of digits (0/2) 1  Attention: Read list of letters (0/1) 1  Attention: Serial 7 subtraction starting at 100 (0/3) 3  Language: Repeat phrase (0/2) 2  Language : Fluency (0/1) 1  Abstraction (0/2) 2  Delayed Recall (0/5) 0  Orientation (0/6) 5  Total 18  Adjusted Score (based on education) 19   Cranial nerves: There is good facial symmetry. There is facial hypomimia.  Pupils are equal round and reactive to light bilaterally. Fundoscopic exam reveals clear margins bilaterally. Extraocular muscles are intact. There is poor smooth pursuit.  The visual fields are full to confrontational testing. The speech is fluent and clear.  He is very  hypophonic.   Soft palate rises symmetrically and there is no tongue deviation. Hearing is intact to conversational tone. Sensation: Sensation is intact to light touch throughout. Motor: Strength is 5/5 in the bilateral upper and lower extremities.   Shoulder shrug is equal and symmetric.  There is no pronator drift.   Movement examination: Tone: There is mild-modincreased tone bilaterally today in the UE (some gegenhalten as well).  Tone in the LE is normal Abnormal movements: mild dyskinesia, mostly in the legs Coordination:  There is decremation with RAM's, with all form of RAMS, including alternating supination and pronation of the forearm, hand opening and closing, finger taps, heel taps and toe taps, right more than left and UE more than LE. Gait and Station: The patient has no difficulty arising out of a deep-seated chair without the use of the hands; in fact, he cannot do it without pushing off.  No start hesitation today and walks well  ASSESSMENT/PLAN:  1.  Parkinsonism.  I suspect that this does represent idiopathic Parkinson's disease.  The patient has tremor, bradykinesia, rigidity and postural instability.  -He is on carbidopa/levodopa 25/100, 2 tablets at 9 AM/noon/3 PM and one tablet at 7 PM but he never took the 7pm dose and doesn't really want to change it.  He will continue carbidopa/levodopa 50/200 at bedtime.   -encouraged pt to add CV exercise.  Has stationary bike where he lives.   2.  Parkinson's disease dementia  -He is living at assisted living.  I would like to see them manage meds but he is doing that for now. 3.  Constipation  -This is frequently associated with Parkinson's disease.  I did give him a copy of the rancho recipe again today. 4.  Near syncope  -seems to be doing well in this regard.  5.  Diabetic peripheral neuropathy  -Safety was discussed.  -The patient is on gabapentin, 600 mg at night.  He tells me that this is primarily for sleep.   6.   Headache  -STA without ropiness or tenderness.  No concerning features.  Increase gabapentin to 300 in AM and continue 600 at night.  -if no help, may hold levodpa or decrease for a few days as has trouble describing the headache and some of it sounds like lightheadedness but also with pressure in frontal region and worse at meals (and takes levodopa at meals).    -will consider neuroimaging if no better (maybe CT to image sinus since frontal)  -will call in few weeks and f/u on this 7.  Gout  -asks me about RF colchicine and told her that needs to come from PCP and to call Dr. Clovis RileyMitchell 8.  Follow-up in the next 3 months, sooner should new neurologic issues arise.

## 2016-03-28 NOTE — Patient Instructions (Signed)
Increase Gabapentin to one tablet in the morning and two tablets at night.

## 2016-04-18 ENCOUNTER — Telehealth: Payer: Self-pay | Admitting: Neurology

## 2016-04-18 NOTE — Telephone Encounter (Signed)
Called patient to see how his headache is doing. He answered stated he could not talk and would call back.

## 2016-04-18 NOTE — Telephone Encounter (Signed)
Spoke with patient and he states headaches are better since increasing Gabapentin.

## 2016-06-29 NOTE — Progress Notes (Deleted)
Gabriel Ramirez was seen today in the movement disorders clinic for neurologic consultation at the request of Lavella Hammock, MD.   The patient presents today to discuss his parkinsonism.  No one accompanies him to the visit (family is in the waiting room but he refused to let them back to the appointment).   Limited prior neurology records are available to me and the faxed copy that is available to me is virtually unreadable.  He was previously taken care of in [place].  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  Pt states that he thinks that he was dx in 2012.  He states that his first symptom was that he was "slowing down" and getting "lightheaded."  Over time, he has had some right hand tremor (rarely); he is right hand dominant.  He is on carbidopa/levodopa 25/100, 2 po tid; states that it was increased in May but cannot remember what he was taking before the increase.  He has trouble telling me if the increase helped.  He takes the medication at 9:30 am/4pm/8-9pm (goes to bed about 11 pm).  He admits that he is sleeping a lot during the day.    08/30/15 update:  The patient returns today for follow-up.  He is on carbidopa/levodopa 25/100, 2 tablets 3 times per day (9 AM/1 PM/6 p.m.) and last visit we added carbidopa/levodopa 50/200 at night.  He states that it helped morning stiffness.  I referred him to [facility] for home physical and speech therapy.   The therapy has helped.  Since our last visit, the patient denies any falls.  He denies hallucinations.    I was able to get a copy of his MRI of the brain since our last visit, but it was a very difficult to read fax.  I am unsure if it was dated 12/17/2013 or 12/18/2014.  Regardless, the report stated that it showed chronic microvascular changes and an old lacune in the right frontal region.  I have tried multiple times to get records from [place] and no records were sent.   He has not been exercising.  He initially reports headache, but upon further questioning he actually has no head pain at all and is trying to tell me that he is having near syncopal episodes after he eats lunch every day.  He tells me that this is the same feeling that he had before he had to have his cardiac stent placed.  He had a cardiologist when he lived in [place], but has not yet established here.  He denies any chest pain.  He states that he does not think that the episodes are associated with taking his noon medication as he had these episodes years ago before he started taking levodopa.  He also c/o constipation.  10/25/15 update:  The patient is following up today.  Last visit, his levodopa was slightly increased.  He is supposed to be taking carbidopa/levodopa 25/100, 2 tablets at 9 AM, noon, 3 PM, and one tablet at 7 PM in addition to carbidopa/levodopa 50/200 at night.  He did not add that one at 7 last visit like he was supposed to.  He doesn't remember [place] talking about it.  He has seen cardiology since our last visit.  I reviewed Dr. Ludwig Clarks records.  He is taking a wait and see approach in deciding on whether or not to decrease his blood pressure medications.  The patient has described some lightheadedness, but he has not had any syncopal episodes.  He is on gabapentin, 600 mg at night for diabetic peripheral neuropathy.  He c/o nausea today.  It doesn't seem to come at any particular time.  He states that it used to be "every once in a while" but now it is more regular.      03/28/16 update:  The patient is following up today.  He is taking carbidopa/levodopa 25/100, 2 tablets at 9 AM, noon, 3 PM, and carbidopa/levodopa 50/200 at night.  A higher dosage has been recommended, but he really has not wanted to go up on the medication.  He is riding stationary bike 2-3 times a week.   He denies any falls since last visit.  The patient has described some lightheadedness, but he has not had any  syncopal episodes.  He is on gabapentin, 600 mg at night for diabetic peripheral neuropathy.  Having headaches, mostly facial.  Coming daily.  Cannot describe quality to me.  May take advil to relieve and does that one time a day.  Has no photophobia and no phonophobia.  Been going on about a month  07/01/16 update:  Pt f/u today.  carbidopa/levodopa 25/100, 2 tablets at 9 AM/noon/3 PM and carbidopa/levodopa 50/200 at bedtime.  Exercising on stationary bike a few days a week.  No falls.  No hallucination.  Some lightheadedness but not disabling.  Still on gabapentin 600 mg q hs for sleep and neuropathic pain at night.  Neuroimaging has previously been performed.  It is not available for my review today.  PREVIOUS MEDICATIONS: Sinemet  ALLERGIES:  No Known Allergies  CURRENT MEDICATIONS:  Outpatient Encounter Prescriptions as of 07/01/2016  Medication Sig  . amLODipine (NORVASC) 10 MG tablet Take 10 mg by mouth daily.  Marland Kitchen. atorvastatin (LIPITOR) 20 MG tablet Take 20 mg by mouth daily.  . carbidopa-levodopa (SINEMET CR) 50-200 MG tablet TAKE 1 TABLET BY MOUTH AT BEDTIME.  . carbidopa-levodopa (SINEMET IR) 25-100 MG tablet Take 2 tablets at 9 am. 2 tablets at 12 pm. 2 tablets at 3 pm. 1 tablet at 7 pm. (Patient taking differently: Take 2 tablets at 8 am. 2 tablets at 12 pm. 2 tablets at 4:30 pm.)  . colchicine 0.6 MG tablet TAKE 1 TABLET EVERY 8 HOURS UNTIL GOUT ATTACK STOPS  . gabapentin (NEURONTIN) 300 MG capsule TAKE 2 CAPSULES BY MOUTH EVERY DAY AT BEDTIME  . JANUVIA 50 MG tablet Take 50 mg by mouth daily.  Letta Pate. ONETOUCH DELICA LANCETS 33G MISC USE AS DIRECTED ONCE DAILY TO TEST BLOOD SUGAR  . ONETOUCH VERIO test strip USE TO CHECK CBG EVERY AM, ALTERNATING BETWEEN AM AND PM MEALS  . quinapril (ACCUPRIL) 20 MG tablet Take 20 mg by mouth daily.   No facility-administered encounter medications on file as of 07/01/2016.     PAST MEDICAL HISTORY:   Past Medical History:  Diagnosis Date  . CAD  (coronary artery disease)    [place]; [facility]  . Diabetes (HCC)   . Hyperlipidemia   . Hypertension   . Parkinson's disease (HCC)     PAST SURGICAL HISTORY:   Past Surgical History:  Procedure Laterality Date  . CORONARY ANGIOPLASTY WITH STENT PLACEMENT      SOCIAL HISTORY:   Social History   Social History  . Marital status: Single    Spouse name: N/A  . Number of children: N/A  . Years of education: N/A  Occupational History  . Not on file.   Social History Main Topics  . Smoking status: Never Smoker  . Smokeless tobacco: Not on file  . Alcohol use No  . Drug use: No  . Sexual activity: Not on file   Other Topics Concern  . Not on file   Social History Narrative  . No narrative on file    FAMILY HISTORY:   Family Status  Relation Status  . Mother Deceased   alzheimer's  . Father Deceased   HTN  . Sister Deceased   x3    ROS:  A complete 10 system review of systems was obtained and was unremarkable apart from what is mentioned above.  PHYSICAL EXAMINATION:    VITALS:   There were no vitals filed for this visit.  GEN:  The patient appears stated age and is in NAD. HEENT:  Normocephalic, atraumatic.  The mucous membranes are moist. The superficial temporal arteries are without ropiness or tenderness. CV:  RRR Lungs:  CTAB Neck/HEME:  There are no carotid bruits bilaterally.  Neurological examination:  Orientation:  Montreal Cognitive Assessment  06/28/2015  Visuospatial/ Executive (0/5) 1  Naming (0/3) 2  Attention: Read list of digits (0/2) 1  Attention: Read list of letters (0/1) 1  Attention: Serial 7 subtraction starting at 100 (0/3) 3  Language: Repeat phrase (0/2) 2  Language : Fluency (0/1) 1  Abstraction (0/2) 2  Delayed Recall (0/5) 0  Orientation (0/6) 5  Total 18  Adjusted Score (based on education) 19   Cranial nerves: There is good facial symmetry. There is facial hypomimia.  Pupils are equal round and  reactive to light bilaterally. Fundoscopic exam reveals clear margins bilaterally. Extraocular muscles are intact. There is poor smooth pursuit.  The visual fields are full to confrontational testing. The speech is fluent and clear.  He is very hypophonic.   Soft palate rises symmetrically and there is no tongue deviation. Hearing is intact to conversational tone. Sensation: Sensation is intact to light touch throughout. Motor: Strength is 5/5 in the bilateral upper and lower extremities.   Shoulder shrug is equal and symmetric.  There is no pronator drift.   Movement examination: Tone: There is mild-modincreased tone bilaterally today in the UE (some gegenhalten as well).  Tone in the LE is normal Abnormal movements: mild dyskinesia, mostly in the legs Coordination:  There is decremation with RAM's, with all form of RAMS, including alternating supination and pronation of the forearm, hand opening and closing, finger taps, heel taps and toe taps, right more than left and UE more than LE. Gait and Station: The patient has no difficulty arising out of a deep-seated chair without the use of the hands; in fact, he cannot do it without pushing off.  No start hesitation today and walks well  ASSESSMENT/PLAN:  1.  Parkinsonism.  I suspect that this does represent idiopathic Parkinson's disease.  The patient has tremor, bradykinesia, rigidity and postural instability.  -He is on carbidopa/levodopa 25/100, 2 tablets at 9 AM/noon/3 PM and one tablet at 7 PM but he never took the 7pm dose and doesn't really want to change it.  He will continue carbidopa/levodopa 50/200 at bedtime.   -encouraged pt to add CV exercise.  Has stationary bike where he lives.   2.  Parkinson's disease dementia  -He is living at assisted living.  I would like to see them manage meds but he is doing that for now. 3.  Constipation  -This is  frequently associated with Parkinson's disease.  I did give him a copy of the rancho recipe  again today. 4.  Near syncope  -seems to be doing well in this regard.   5.  Diabetic peripheral neuropathy  -Safety was discussed.  -The patient is on gabapentin, 600 mg at night.  He tells me that this is primarily for sleep.   6.  Headache  -STA without ropiness or tenderness.  No concerning features.  Increase gabapentin to 300 in AM and continue 600 at night.  -if no help, may hold levodpa or decrease for a few days as has trouble describing the headache and some of it sounds like lightheadedness but also with pressure in frontal region and worse at meals (and takes levodopa at meals).    -will consider neuroimaging if no better (maybe CT to image sinus since frontal)  -will call in few weeks and f/u on this 7.  Gout  -asks me about RF colchicine and told her that needs to come from PCP and to call Dr. Clovis Riley 8.  Follow-up in the next 3 months, sooner should new neurologic issues arise.

## 2016-06-30 ENCOUNTER — Telehealth: Payer: Self-pay | Admitting: Neurology

## 2016-06-30 NOTE — Telephone Encounter (Signed)
Left message on machine for patient to call back.

## 2016-06-30 NOTE — Telephone Encounter (Signed)
Patient did increase Gabapentin. I called him on 04/18/16 to check on him and he stated headaches were much better. This is a recent change.

## 2016-06-30 NOTE — Telephone Encounter (Signed)
Gabriel Ramirez 01/07/1946. He called to cancel his appointment for tomorrow 10/3 due to no ride here. We did get him rescheduled. He did say he was feeling light headed. His # is [phone]. Thank you

## 2016-06-30 NOTE — Telephone Encounter (Signed)
See last office visit under headache recommendations.  Did he increase the gabapentin after last visit

## 2016-06-30 NOTE — Telephone Encounter (Signed)
Just wanted to make sure still on higher dose.  He can hold levodopa for few days and although PD sx's will likely get worse, he can let us know about lightheadedness (make sure to differentiate from head pressure/pain because he was using words interchangeably last visit

## 2016-06-30 NOTE — Telephone Encounter (Signed)
Patient made aware. He will stop Levodopa. I will call him Thursday and see how he is doing.

## 2016-06-30 NOTE — Telephone Encounter (Signed)
Spoke with patient and he states that his lightheadedness had gone away for awhile but started back yesterday along with headaches. When he developed headaches in the past he could normally take a nap and they would go away, but yesterday he wake up and it was worse and had increased light headedness. He seems okay today. He has had no LOC, no falls. He has r/s his appt to 07/21/16 at 11:15 am. I encouraged him to keep that follow up appt to discuss symptoms and to call back if symptoms get worse. He expressed understanding.

## 2016-07-01 ENCOUNTER — Ambulatory Visit: Payer: Medicare Other | Admitting: Neurology

## 2016-07-03 ENCOUNTER — Telehealth: Payer: Self-pay | Admitting: Neurology

## 2016-07-03 NOTE — Telephone Encounter (Signed)
-----   Message from Silvio PateJade L Itzelle Gains, [place] sent at 06/30/2016  4:17 PM EDT ----- Call patient to see how lightheadedness is off Levodopa

## 2016-07-03 NOTE — Telephone Encounter (Signed)
Left message on machine for patient to call back.  To see how his lightheadedness is doing off Levodopa. Awaiting call back.

## 2016-07-08 ENCOUNTER — Encounter (HOSPITAL_COMMUNITY): Payer: Self-pay | Admitting: Emergency Medicine

## 2016-07-08 ENCOUNTER — Emergency Department (HOSPITAL_COMMUNITY)
Admission: EM | Admit: 2016-07-08 | Discharge: 2016-07-08 | Disposition: A | Discharge status: Home / Self Care | Payer: Federal, State, Local not specified - PPO | Attending: Emergency Medicine | Admitting: Emergency Medicine

## 2016-07-08 DIAGNOSIS — E860 Dehydration: Secondary | ICD-10-CM | POA: Diagnosis present

## 2016-07-08 DIAGNOSIS — I10 Essential (primary) hypertension: Secondary | ICD-10-CM | POA: Diagnosis not present

## 2016-07-08 DIAGNOSIS — Z79899 Other long term (current) drug therapy: Secondary | ICD-10-CM | POA: Diagnosis not present

## 2016-07-08 DIAGNOSIS — R42 Dizziness and giddiness: Secondary | ICD-10-CM | POA: Diagnosis not present

## 2016-07-08 DIAGNOSIS — Z7982 Long term (current) use of aspirin: Secondary | ICD-10-CM | POA: Diagnosis not present

## 2016-07-08 DIAGNOSIS — E119 Type 2 diabetes mellitus without complications: Secondary | ICD-10-CM | POA: Insufficient documentation

## 2016-07-08 DIAGNOSIS — I2510 Atherosclerotic heart disease of native coronary artery without angina pectoris: Secondary | ICD-10-CM | POA: Diagnosis not present

## 2016-07-08 DIAGNOSIS — G20 Parkinson's disease: Secondary | ICD-10-CM | POA: Insufficient documentation

## 2016-07-08 LAB — BASIC METABOLIC PANEL
Anion gap: 7 (ref 5–15)
BUN: 22 mg/dL — ABNORMAL HIGH (ref 6–20)
CO2: 26 mmol/L (ref 22–32)
Calcium: 8.8 mg/dL — ABNORMAL LOW (ref 8.9–10.3)
Chloride: 104 mmol/L (ref 101–111)
Creatinine, Ser: 0.98 mg/dL (ref 0.61–1.24)
GFR calc Af Amer: >60 mL/min (ref >60)
GFR calc non Af Amer: >60 mL/min (ref >60)
Glucose, Bld: 127 mg/dL — ABNORMAL HIGH (ref 65–99)
Potassium: 4 mmol/L (ref 3.5–5.1)
Sodium: 137 mmol/L (ref 135–145)

## 2016-07-08 LAB — URINALYSIS, ROUTINE W REFLEX MICROSCOPIC
Bilirubin Urine: NEGATIVE
Glucose, UA: NEGATIVE mg/dL
Hgb urine dipstick: NEGATIVE
Ketones, ur: NEGATIVE mg/dL
Leukocytes, UA: NEGATIVE
Nitrite: NEGATIVE
Protein, ur: NEGATIVE mg/dL
Specific Gravity, Urine: 1.011 (ref 1.005–1.030)
pH: 6.5 (ref 5.0–8.0)

## 2016-07-08 LAB — CBC
HCT: 33.9 % — ABNORMAL LOW (ref 39.0–52.0)
Hemoglobin: 10.9 g/dL — ABNORMAL LOW (ref 13.0–17.0)
MCH: 33.6 pg (ref 26.0–34.0)
MCHC: 32.2 g/dL (ref 30.0–36.0)
MCV: 104.6 fL — ABNORMAL HIGH (ref 78.0–100.0)
Platelets: 205 10*3/uL (ref 150–400)
RBC: 3.24 MIL/uL — ABNORMAL LOW (ref 4.22–5.81)
RDW: 13.3 % (ref 11.5–15.5)
WBC: 3.8 10*3/uL — ABNORMAL LOW (ref 4.0–10.5)

## 2016-07-08 MED ORDER — SODIUM CHLORIDE 0.9 % IV BOLUS (SEPSIS)
500.0000 mL | Freq: Once | INTRAVENOUS | Status: AC
  Administered 2016-07-08: 500 mL via INTRAVENOUS

## 2016-07-08 NOTE — ED Provider Notes (Signed)
Blood pressure 164/91, pulse 77, temperature 97.9 F (36.6 C), temperature source Oral, resp. rate 11, SpO2 100 %.  Assuming care from Dr. Juleen ChinaKohut.  In short, Gabriel Ramirez is a 70 y.o. male with a chief complaint of Dehydration .  Refer to the original H&P for additional details.  The current plan of care is to follow UA and reassess the patient with likely discharge.  04:33 PM Patient reports feeling overall better. Headache has resolved. Urinalysis is normal. No abx or further treatment at this time. I discussed the test results with the patient who understands and feels comfortable with discharge and follow up plan.   [name], MD    [name], MD 07/08/16 [id number]

## 2016-07-08 NOTE — ED Provider Notes (Signed)
[facility]-EMERGENCY DEPT Provider Note   CSN: [id number] Arrival date & time: 07/08/16  1303  By signing my name below, I, Sonum Patel, attest that this documentation has been prepared under the direction and in the presence of Raeford RazorStephen Lysa Livengood, MD. Electronically Signed: Sonum Patel, Neurosurgeoncribe. 07/08/16. 1:29 PM.  History   Chief Complaint Chief Complaint  Patient presents with  . Dehydration    The history is provided by the patient. No language interpreter was used.    HPI Comments: Gabriel Ramirez is a 70 y.o. male brought in by ambulance, who presents to the Emergency Department complaining of a constant, gradually worsened posterior HA with associated lightheadedness and nausea that began last night. He has a history of Parkinson's and states his medication was recently changed; states the medication was stopped and then restarted after 1 week. He has been eating and drinking well. He states he lives at an assisted living facility. He denies fever, vomiting, diarrhea, dysuria, SOB, sore throat, falls.   Past Medical History:  Diagnosis Date  . CAD (coronary artery disease)    [place] in [place]; [facility]  . Diabetes (HCC)   . Hyperlipidemia   . Hypertension   . Parkinson's disease [facility](HCC)     Patient Active Problem List   Diagnosis Date Noted  . CAD (coronary artery disease) 08/31/2015  . Essential hypertension 08/31/2015  . Hyperlipidemia 08/31/2015  . Parkinson's disease (HCC) 08/31/2015    Past Surgical History:  Procedure Laterality Date  . CORONARY ANGIOPLASTY WITH STENT PLACEMENT         Home Medications    Prior to Admission medications   Medication Sig Start Date End Date Taking? Authorizing Provider  amLODipine (NORVASC) 10 MG tablet Take 10 mg by mouth daily. 06/15/15   Historical Provider, MD  atorvastatin (LIPITOR) 20 MG tablet Take 20 mg by mouth daily. 06/15/15   Historical Provider, MD  carbidopa-levodopa (SINEMET CR) 50-200 MG tablet TAKE 1 TABLET BY  MOUTH AT BEDTIME. 01/04/16   Rebecca S Tat, DO  carbidopa-levodopa (SINEMET IR) 25-100 MG tablet Take 2 tablets at 9 am. 2 tablets at 12 pm. 2 tablets at 3 pm. 1 tablet at 7 pm. Patient taking differently: Take 2 tablets at 8 am. 2 tablets at 12 pm. 2 tablets at 4:30 pm. 08/30/15   Lurena Joinerebecca S Tat, DO  colchicine 0.6 MG tablet TAKE 1 TABLET EVERY 8 HOURS UNTIL GOUT ATTACK STOPS 02/15/16   Historical Provider, MD  gabapentin (NEURONTIN) 300 MG capsule TAKE 2 CAPSULES BY MOUTH EVERY DAY AT BEDTIME 06/15/15   Historical Provider, MD  JANUVIA 50 MG tablet Take 50 mg by mouth daily. 06/12/15   Historical Provider, MD  Dola ArgyleNETOUCH DELICA LANCETS 33G MISC USE AS DIRECTED ONCE DAILY TO TEST BLOOD SUGAR 01/25/16   Historical Provider, MD  ONETOUCH VERIO test strip USE TO CHECK CBG EVERY AM, ALTERNATING BETWEEN AM AND PM MEALS 03/05/16   Historical Provider, MD  quinapril (ACCUPRIL) 20 MG tablet Take 20 mg by mouth daily. 06/15/15   Historical Provider, MD    Family History Family History  Problem Relation Age of Onset  . Alzheimer's disease Mother   . Hypertension Father     Social History Social History  Substance Use Topics  . Smoking status: Never Smoker  . Smokeless tobacco: Never Used  . Alcohol use No     Allergies   Review of patient's allergies indicates no known allergies.   Review of Systems Review of Systems  Constitutional:  Negative for fever.  HENT: Negative for sore throat.   Respiratory: Negative for shortness of breath.   Gastrointestinal: Positive for nausea. Negative for diarrhea.  Genitourinary: Negative for dysuria.  Neurological: Positive for light-headedness and headaches.  All other systems reviewed and are negative.    Physical Exam Updated Vital Signs BP 132/72 (BP Location: Left Arm)   Pulse 77   Temp 97.9 F (36.6 C) (Oral)   Resp 18   SpO2 100%   Physical Exam  Constitutional: He is oriented to person, place, and time. He appears well-developed and  well-nourished.  HENT:  Head: Normocephalic and atraumatic.  No scalp tenderness.   Eyes: EOM are normal.  Neck: Normal range of motion.  No nuchal rigidity.   Cardiovascular: Normal rate, regular rhythm, normal heart sounds and intact distal pulses.   Pulmonary/Chest: Effort normal and breath sounds normal. No respiratory distress.  Abdominal: Soft. He exhibits no distension. There is no tenderness.  Musculoskeletal: Normal range of motion. He exhibits edema.  Mild pitting lower extremity edema  Neurological: He is alert and oriented to person, place, and time.  Slow and deliberate speech and movements. No focal motor deficits.   Skin: Skin is warm and dry.  Psychiatric: He has a normal mood and affect. Judgment normal.  Nursing note and vitals reviewed.    ED Treatments / Results  DIAGNOSTIC STUDIES: Oxygen Saturation is 100% on RA, normal by my interpretation.    COORDINATION OF CARE: 1:28 PM Discussed treatment plan with pt at bedside and pt agreed to plan.   Labs (all labs ordered are listed, but only abnormal results are displayed) Labs Reviewed  BASIC METABOLIC PANEL - Abnormal; Notable for the following:       Result Value   Glucose, Bld 127 (*)    BUN 22 (*)    Calcium 8.8 (*)    All other components within normal limits  CBC - Abnormal; Notable for the following:    WBC 3.8 (*)    RBC 3.24 (*)    Hemoglobin 10.9 (*)    HCT 33.9 (*)    MCV 104.6 (*)    All other components within normal limits  URINALYSIS, ROUTINE W REFLEX MICROSCOPIC (NOT AT [facility])    EKG  EKG Interpretation  Date/Time:  Tuesday July 08 2016 13:20:00 EDT Ventricular Rate:  73 PR Interval:    QRS Duration: 136 QT Interval:  374 QTC Calculation: 413 R Axis:   -10 Text Interpretation:  Sinus rhythm Nonspecific intraventricular conduction delay Minimal ST depression, inferior leads Borderline ST elevation, lateral leads No old tracing to compare Confirmed by [facility]  MD-J, JON ([id number]) on  07/08/2016 1:28:50 PM Also confirmed by [facility]  MD-J, JON [phone]), editor [place] CT, Jola Babinski [phone])  on 07/08/2016 1:42:53 PM       Radiology No results found.  Procedures Procedures (including critical care time)  Medications Ordered in ED Medications - No data to display   Initial Impression / Assessment and Plan / ED Course  I have reviewed the triage vital signs and the nursing notes.  Pertinent labs & imaging results that were available during my care of the patient were reviewed by me and considered in my medical decision making (see chart for details).  Clinical Course    70yM with dizziness. May be some degree of dehydration. BUN 22 with Cr of 0.98. Reportedly orthostatic for EMS. Given IVF. Normotensive in ED. Hard to get great EKG with parkinsons, but no overtly concerning  findings. Declining meds for headache. UA pending. Can be discharged with PO abx if consistent with UTI.    Final Clinical Impressions(s) / ED Diagnoses   Final diagnoses:  Dizziness    New Prescriptions New Prescriptions   No medications on file   I personally preformed the services scribed in my presence. The recorded information has been reviewed is accurate. Raeford Razor, MD.    Raeford Razor, MD 07/09/16 1357

## 2016-07-08 NOTE — Telephone Encounter (Signed)
Tried again to contact patient with no answer.

## 2016-07-08 NOTE — ED Notes (Signed)
nurse is in the room collecting the blood

## 2016-07-08 NOTE — ED Triage Notes (Signed)
Per EMS-states headache and dizziness when standing-stated around dinner time last night-orthostatic changes when standing-2340mHg-500 cc of NS given in route-

## 2016-07-08 NOTE — ED Notes (Signed)
Pt made aware UA needed. Unable to provide at this time. Will recheck.

## 2016-07-08 NOTE — ED Notes (Signed)
Bed: [id number] Expected date:  Expected time:  Means of arrival:  Comments: RM 18

## 2016-07-10 ENCOUNTER — Telehealth: Payer: Self-pay | Admitting: Neurology

## 2016-07-10 NOTE — Telephone Encounter (Signed)
Patient needs to talk to someone he is not feeling well please call [phone]

## 2016-07-10 NOTE — Telephone Encounter (Signed)
Spoke with patient- he states he started feeling lightheaded and dizzy and was evaluated in the ER two days ago. Still feeling bad.   He stopped his Levodopa as instructed last week for two days, he states the pain all over his body was unbearable so he restarted Levodopa.   He does state that his symptoms seemed a little better off of the medication.   Please advise.

## 2016-07-11 ENCOUNTER — Telehealth: Payer: Self-pay | Admitting: Neurology

## 2016-07-11 NOTE — Telephone Encounter (Signed)
Does he have a follow up appt?  May need to look at rytary or the CR versions

## 2016-07-11 NOTE — Telephone Encounter (Signed)
Appt scheduled with patient

## 2016-07-11 NOTE — Telephone Encounter (Signed)
Left message on machine for patient to call back and schedule an appt.   He cancelled his 07/01/16 appt. He had one scheduled for 07/21/16 and cancelled this appt as well.

## 2016-07-11 NOTE — Telephone Encounter (Signed)
Gabriel Ramirez [place] 05/03/1946. He was returning your call [phone]. Thank you

## 2016-07-18 ENCOUNTER — Telehealth: Payer: Self-pay | Admitting: Neurology

## 2016-07-18 ENCOUNTER — Ambulatory Visit: Payer: Medicare Other | Admitting: Neurology

## 2016-07-18 NOTE — Telephone Encounter (Signed)
Patient want someone to call him please call him at [phone]

## 2016-07-18 NOTE — Telephone Encounter (Signed)
Spoke with patient. He was confused about why his appt was cancelled today. I told him all I see is cancelled by patient and I don't have an answer for him. I did make him aware that he has an appt scheduled next Thursday 07/24/16 at 9:15 am. He states he will keep appt.

## 2016-07-21 ENCOUNTER — Ambulatory Visit: Payer: Medicare Other | Admitting: Neurology

## 2016-07-22 NOTE — Progress Notes (Signed)
Gabriel Ramirez was seen today in the movement disorders clinic for neurologic consultation at the request of Lavella Hammock, MD.   The patient presents today to discuss his parkinsonism.  No one accompanies him to the visit (family is in the waiting room but he refused to let them back to the appointment).   Limited prior neurology records are available to me and the faxed copy that is available to me is virtually unreadable.  He was previously taken care of in [place].  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  Pt states that he thinks that he was dx in 2012.  He states that his first symptom was that he was "slowing down" and getting "lightheaded."  Over time, he has had some right hand tremor (rarely); he is right hand dominant.  He is on carbidopa/levodopa 25/100, 2 po tid; states that it was increased in May but cannot remember what he was taking before the increase.  He has trouble telling me if the increase helped.  He takes the medication at 9:30 am/4pm/8-9pm (goes to bed about 11 pm).  He admits that he is sleeping a lot during the day.    08/30/15 update:  The patient returns today for follow-up.  He is on carbidopa/levodopa 25/100, 2 tablets 3 times per day (9 AM/1 PM/6 p.m.) and last visit we added carbidopa/levodopa 50/200 at night.  He states that it helped morning stiffness.  I referred him to [facility] for home physical and speech therapy.   The therapy has helped.  Since our last visit, the patient denies any falls.  He denies hallucinations.    I was able to get a copy of his MRI of the brain since our last visit, but it was a very difficult to read fax.  I am unsure if it was dated 12/17/2013 or 12/18/2014.  Regardless, the report stated that it showed chronic microvascular changes and an old lacune in the right frontal region.  I have tried multiple times to get records from [place] and no records were sent.   He has not been exercising.  He initially reports headache, but upon further questioning he actually has no head pain at all and is trying to tell me that he is having near syncopal episodes after he eats lunch every day.  He tells me that this is the same feeling that he had before he had to have his cardiac stent placed.  He had a cardiologist when he lived in [place], but has not yet established here.  He denies any chest pain.  He states that he does not think that the episodes are associated with taking his noon medication as he had these episodes years ago before he started taking levodopa.  He also c/o constipation.  10/25/15 update:  The patient is following up today.  Last visit, his levodopa was slightly increased.  He is supposed to be taking carbidopa/levodopa 25/100, 2 tablets at 9 AM, noon, 3 PM, and one tablet at 7 PM in addition to carbidopa/levodopa 50/200 at night.  He did not add that one at 7 last visit like he was supposed to.  He doesn't remember [place] talking about it.  He has seen cardiology since our last visit.  I reviewed Dr. Ludwig Clarks records.  He is taking a wait and see approach in deciding on whether or not to decrease his blood pressure medications.  The patient has described some lightheadedness, but he has not had any syncopal episodes.  He is on gabapentin, 600 mg at night for diabetic peripheral neuropathy.  He c/o nausea today.  It doesn't seem to come at any particular time.  He states that it used to be "every once in a while" but now it is more regular.      03/28/16 update:  The patient is following up today.  He is taking carbidopa/levodopa 25/100, 2 tablets at 9 AM, noon, 3 PM, and carbidopa/levodopa 50/200 at night.  A higher dosage has been recommended, but he really has not wanted to go up on the medication.  He is riding stationary bike 2-3 times a week.   He denies any falls since last visit.  The patient has described some lightheadedness, but he has not had any  syncopal episodes.  He is on gabapentin, 600 mg at night for diabetic peripheral neuropathy.  Having headaches, mostly facial.  Coming daily.  Cannot describe quality to me.  May take advil to relieve and does that one time a day.  Has no photophobia and no phonophobia.  Been going on about a month  07/24/16 update:  Pt f/u today.  He is on carbidopa/levodopa 25/100, 2 tablets at 9 AM/noon/3 PM and carbidopa/levodopa 50/200 at bedtime.  Exercising on stationary bike a few days a week.  No falls.  No hallucination.  In ED on 07/08/16 with c/o HA and lightheadedness.  This has been a chronic c/o for him.  Work up was negative. Last visit, I increased his gabapentin to 300 mg in AM and 600 mg in PM to see if that would help with headache.  It doesn't appear that he did this but when we called him to follow up he stated that his headache was better and lightheadedness got better as well. States today that only on gabapentin at night.  However, the lightheadedness came back.  Sometimes he uses the words headaches and dizziness interchangeably but overall he does state that headaches are better and "off balance" is the biggest issue.  The dizziness is all day.    I had him try and hold the levodopa for a few days but he was only able to hold it for 2 days and then stated that the pain over his whole body was so bad that he restarted the medication.  He didn't think perhaps his complaints of lightheadedness/headache were better for the 2 days that he was off of the medication, but has some difficulty telling if this is the case.  Neuroimaging has previously been performed.  It is not available for my review today.  PREVIOUS MEDICATIONS: Sinemet  ALLERGIES:  No Known Allergies  CURRENT MEDICATIONS:  Outpatient Encounter Prescriptions as of 07/24/2016  Medication Sig  . amLODipine (NORVASC) 10 MG tablet Take 10 mg by mouth daily.  Marland Kitchen aspirin EC 81 MG tablet Take 81 mg by mouth daily.  Marland Kitchen atorvastatin (LIPITOR)  20 MG tablet Take 20 mg by mouth at bedtime.   . carbidopa-levodopa (SINEMET CR) 50-200 MG tablet Take 1 tablet by mouth at bedtime.  . carbidopa-levodopa (SINEMET IR) 25-100 MG tablet Take 1-2 tablets by mouth 4 (four) times daily. Pt takes two tablets at 9am, two at 12pm, two at 3pm, and one at 7pm.  . gabapentin (NEURONTIN) 300 MG capsule Take 600 mg by mouth at bedtime.  . quinapril (ACCUPRIL) 20 MG tablet Take 20 mg by mouth daily.  . sitaGLIPtin (JANUVIA)  50 MG tablet Take 50 mg by mouth daily.  Marland Kitchen. zolpidem (AMBIEN) 10 MG tablet Take 10 mg by mouth at bedtime as needed for sleep.  . colchicine 0.6 MG tablet Take 0.6 mg by mouth every 8 (eight) hours as needed (for gout flares).   No facility-administered encounter medications on file as of 07/24/2016.     PAST MEDICAL HISTORY:   Past Medical History:  Diagnosis Date  . CAD (coronary artery disease)    [place] in [place]; [facility]  . Diabetes (HCC)   . Hyperlipidemia   . Hypertension   . Parkinson's disease (HCC)     PAST SURGICAL HISTORY:   Past Surgical History:  Procedure Laterality Date  . CORONARY ANGIOPLASTY WITH STENT PLACEMENT      SOCIAL HISTORY:   Social History   Social History  . Marital status: Single    Spouse name: N/A  . Number of children: N/A  . Years of education: N/A   Occupational History  . Not on file.   Social History Main Topics  . Smoking status: Never Smoker  . Smokeless tobacco: Never Used  . Alcohol use No  . Drug use: No  . Sexual activity: Not on file   Other Topics Concern  . Not on file   Social History Narrative  . No narrative on file    FAMILY HISTORY:   Family Status  Relation Status  . Mother Deceased   alzheimer's  . Father Deceased   HTN  . Sister Deceased   x3    ROS:  A complete 10 system review of systems was obtained and was unremarkable apart from what is mentioned above.  PHYSICAL EXAMINATION:    VITALS:   Vitals:   07/24/16 0906  BP:  120/80  Pulse: 76  Weight: 213 lb (96.6 kg)  Height: 6\' 3"  (1.905 m)   Orthostatic VS for the past 24 hrs (Last 3 readings):  BP- Lying Pulse- Lying BP- Sitting Pulse- Sitting BP- Standing at 0 minutes Pulse- Standing at 0 minutes  07/24/16 0926 132/80 73 130/62 74 130/62 80     GEN:  The patient appears stated age and is in NAD. HEENT:  Normocephalic, atraumatic.  The mucous membranes are moist. The superficial temporal arteries are without ropiness or tenderness. CV:  RRR Lungs:  CTAB Neck/HEME:  There are no carotid bruits bilaterally.  Neurological examination:  Orientation:  Montreal Cognitive Assessment  06/28/2015  Visuospatial/ Executive (0/5) 1  Naming (0/3) 2  Attention: Read list of digits (0/2) 1  Attention: Read list of letters (0/1) 1  Attention: Serial 7 subtraction starting at 100 (0/3) 3  Language: Repeat phrase (0/2) 2  Language : Fluency (0/1) 1  Abstraction (0/2) 2  Delayed Recall (0/5) 0  Orientation (0/6) 5  Total 18  Adjusted Score (based on education) 19   Cranial nerves: There is good facial symmetry. There is facial hypomimia.  Pupils are equal round and reactive to light bilaterally. Fundoscopic exam reveals clear margins bilaterally. Extraocular muscles are intact. There is poor smooth pursuit.  The visual fields are full to confrontational testing. The speech is fluent and clear.  He is very hypophonic.   Soft palate rises symmetrically and there is no tongue deviation. Hearing is intact to conversational tone. Sensation: Sensation is intact to light touch throughout. Motor: Strength is 5/5 in the bilateral upper and lower extremities.   Shoulder shrug is equal and symmetric.  There is no pronator drift.  Movement examination: Tone: There is modincreased tone bilaterally today in the UE Abnormal movements: no dyskinesia today Coordination:  There is decremation with RAM's, with all form of RAMS, including alternating supination and pronation of  the forearm, hand opening and closing, finger taps, heel taps and toe taps, right more than left and UE more than LE. Gait and Station: The patient has no difficulty arising out of a deep-seated chair without the use of the hands; in fact, he cannot do it without pushing off.  Shuffles.  Slow to walk.  Turns en bloc.  Start hesitation.  ASSESSMENT/PLAN:  1.  Parkinsonism.  I suspect that this does represent idiopathic Parkinson's disease.  The patient has tremor, bradykinesia, rigidity and postural instability.  -He is complaining about headache and dizziness and uses these terms interchangeably.  I am having difficulty figuring out which is really the problem, but think that it is lightheadedness.  I had him hold his levodopa for a few days and he thought perhaps the symptom was better, so I wonder if it was not lightheadedness.  I would like to change him to [place], but he is very difficult to get a hold of on the phone, and this is really something that I need to titrate with samples and then work with the drug company and his insurance on cost.  I am not sure that we would be able to accomplish this with him, so I first will try the older, extended release carbidopa/levodopa 25/100 CR, 3 tablets at 9 AM/noon/3 PM.  He is much underdosed so I increased the medication and accounted for the fact that CR is 30% less effective than IR.  He will continue carbidopa/levodopa 50/200 at bedtime.   -discussed duopa but patient not interested  -encouraged pt to add CV exercise.  Has stationary bike where he lives.   2.  Parkinson's disease dementia  -He is living at assisted living.  I would like to see them manage meds but he is doing that for now. 3.  Constipation  -This is frequently associated with Parkinson's disease.  Has rancho recipe 4.  Near syncope  -seems to be doing well in this regard.   5.  Diabetic peripheral neuropathy  -Safety was discussed.  -The patient is on gabapentin, 600 mg at night.   He tells me that this is primarily for sleep.   6.  Headache  -STA without ropiness or tenderness.  No concerning features.   -tried to raise gabapentin to 300/600 but not sure that he did this 7.  Follow-up in the next 8 weeks, sooner should new neurologic issues arise.  Much greater than 50% of this visit was spent in counseling and coordinating care.  Total face to face time:  30 min

## 2016-07-23 ENCOUNTER — Other Ambulatory Visit: Payer: Self-pay | Admitting: Neurology

## 2016-07-24 ENCOUNTER — Ambulatory Visit (INDEPENDENT_AMBULATORY_CARE_PROVIDER_SITE_OTHER): Payer: Federal, State, Local not specified - PPO | Admitting: Neurology

## 2016-07-24 ENCOUNTER — Encounter: Payer: Self-pay | Admitting: Neurology

## 2016-07-24 VITALS — BP 120/80 | HR 76 | Ht 75.0 in | Wt 213.0 lb

## 2016-07-24 DIAGNOSIS — G20 Parkinson's disease: Secondary | ICD-10-CM

## 2016-07-24 DIAGNOSIS — E1142 Type 2 diabetes mellitus with diabetic polyneuropathy: Secondary | ICD-10-CM

## 2016-07-24 MED ORDER — CARBIDOPA-LEVODOPA ER 25-100 MG PO TBCR: 3.0000 | EXTENDED_RELEASE_TABLET | Freq: Three times a day (TID) | ORAL | 2 refills | Status: DC

## 2016-07-24 NOTE — Patient Instructions (Signed)
1. Stop Carbidopa Levodopa 25/100 IR (immediate release) tablets (the yellow pills).   2. Start Carbidopa Levodopa 25/100 CR (extended release) tablets 3 tablets 3 times daily (at 9 am. 12 pm. 3 pm.) New prescription has been sent to your pharmacy.   3. Continue Carbidopa Levodopa 50/200 CR tablet at bedtime.  4. Continue Gabapentin 300 mg - 1 tablet in the morning and 2 tablets at night.   5. Follow up in 8 weeks.

## 2016-08-01 ENCOUNTER — Telehealth: Payer: Self-pay | Admitting: Neurology

## 2016-08-01 ENCOUNTER — Other Ambulatory Visit: Payer: Self-pay | Admitting: Neurology

## 2016-08-01 NOTE — Telephone Encounter (Signed)
Received call from SevilleStephanie, home health aid from [facility], who states when she came to check on patient and refill his pill box that he did not look good today. He is complaining of increased pressure and tightness in his head. Another nurse Lynden AngCathy was concerned that this was from his increase in Levodopa from 2 TID to 3 TID.   I explained to Judeth CornfieldStephanie that this was increased because we changed him from IR to CR formulation during the day to see if this would help this head pressure he has been complaining of for months. It is not a big increase in medication due to the different makeups of the IR and CR versions. Lynden AngCathy had decided to only put 2 TID of Levodopa CR in his pill box until we called them back (since this was on my voicemail).   They did check his blood pressure which was 150/90 and his blood sugar which was 240.   It does seem that switching to CR formulation isn't making his head pressure better. Do you want to change back to IR? Please advise.

## 2016-08-01 NOTE — Telephone Encounter (Signed)
I'm not sure what "not good" means but if they are concerned about his health acutely, they should most certainly send him to the ER.  As a home health aid, however, stephanie is not to be changing my orders for his medication.  CR levodopa is 30% less bioavailable than IR version and he was WAY undertreated on the IR version.  As for head pressure, it seems that he needs to f/u with PCP about this since it is not related to PD meds or BP fluctuations associated with PD.

## 2016-08-01 NOTE — Telephone Encounter (Signed)
Gave stephanie verbal order for speech evaluation.

## 2016-08-01 NOTE — Telephone Encounter (Signed)
yes

## 2016-08-01 NOTE — Telephone Encounter (Signed)
Judeth CornfieldStephanie made aware.   They are asking if we can do a speech evaluation on him as well. Will call Judeth CornfieldStephanie back [phone]) and let her know.

## 2016-09-04 ENCOUNTER — Ambulatory Visit: Payer: Medicare Other | Admitting: Neurology

## 2016-09-11 ENCOUNTER — Ambulatory Visit: Payer: Medicare Other | Admitting: Neurology

## 2016-09-16 NOTE — Progress Notes (Deleted)
Gabriel Ramirez was seen today in the movement disorders clinic for neurologic consultation at the request of Lavella Hammock, MD.   The patient presents today to discuss his parkinsonism.  No one accompanies him to the visit (family is in the waiting room but he refused to let them back to the appointment).   Limited prior neurology records are available to me and the faxed copy that is available to me is virtually unreadable.  He was previously taken care of in [place].  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  Pt states that he thinks that he was dx in 2012.  He states that his first symptom was that he was "slowing down" and getting "lightheaded."  Over time, he has had some right hand tremor (rarely); he is right hand dominant.  He is on carbidopa/levodopa 25/100, 2 po tid; states that it was increased in May but cannot remember what he was taking before the increase.  He has trouble telling me if the increase helped.  He takes the medication at 9:30 am/4pm/8-9pm (goes to bed about 11 pm).  He admits that he is sleeping a lot during the day.    08/30/15 update:  The patient returns today for follow-up.  He is on carbidopa/levodopa 25/100, 2 tablets 3 times per day (9 AM/1 PM/6 p.m.) and last visit we added carbidopa/levodopa 50/200 at night.  He states that it helped morning stiffness.  I referred him to [facility] for home physical and speech therapy.   The therapy has helped.  Since our last visit, the patient denies any falls.  He denies hallucinations.    I was able to get a copy of his MRI of the brain since our last visit, but it was a very difficult to read fax.  I am unsure if it was dated 12/17/2013 or 12/18/2014.  Regardless, the report stated that it showed chronic microvascular changes and an old lacune in the right frontal region.  I have tried multiple times to get records from [place] and no records were sent.   He has not been exercising.  He initially reports headache, but upon further questioning he actually has no head pain at all and is trying to tell me that he is having near syncopal episodes after he eats lunch every day.  He tells me that this is the same feeling that he had before he had to have his cardiac stent placed.  He had a cardiologist when he lived in [place], but has not yet established here.  He denies any chest pain.  He states that he does not think that the episodes are associated with taking his noon medication as he had these episodes years ago before he started taking levodopa.  He also c/o constipation.  10/25/15 update:  The patient is following up today.  Last visit, his levodopa was slightly increased.  He is supposed to be taking carbidopa/levodopa 25/100, 2 tablets at 9 AM, noon, 3 PM, and one tablet at 7 PM in addition to carbidopa/levodopa 50/200 at night.  He did not add that one at 7 last visit like he was supposed to.  He doesn't remember [place] talking about it.  He has seen cardiology since our last visit.  I reviewed Dr. Ludwig Clarks records.  He is taking a wait and see approach in deciding on whether or not to decrease his blood pressure medications.  The patient has described some lightheadedness, but he has not had any syncopal episodes.  He is on gabapentin, 600 mg at night for diabetic peripheral neuropathy.  He c/o nausea today.  It doesn't seem to come at any particular time.  He states that it used to be "every once in a while" but now it is more regular.      03/28/16 update:  The patient is following up today.  He is taking carbidopa/levodopa 25/100, 2 tablets at 9 AM, noon, 3 PM, and carbidopa/levodopa 50/200 at night.  A higher dosage has been recommended, but he really has not wanted to go up on the medication.  He is riding stationary bike 2-3 times a week.   He denies any falls since last visit.  The patient has described some lightheadedness, but he has not had any  syncopal episodes.  He is on gabapentin, 600 mg at night for diabetic peripheral neuropathy.  Having headaches, mostly facial.  Coming daily.  Cannot describe quality to me.  May take advil to relieve and does that one time a day.  Has no photophobia and no phonophobia.  Been going on about a month  07/24/16 update:  Pt f/u today.  He is on carbidopa/levodopa 25/100, 2 tablets at 9 AM/noon/3 PM and carbidopa/levodopa 50/200 at bedtime.  Exercising on stationary bike a few days a week.  No falls.  No hallucination.  In ED on 07/08/16 with c/o HA and lightheadedness.  This has been a chronic c/o for him.  Work up was negative. Last visit, I increased his gabapentin to 300 mg in AM and 600 mg in PM to see if that would help with headache.  It doesn't appear that he did this but when we called him to follow up he stated that his headache was better and lightheadedness got better as well. States today that only on gabapentin at night.  However, the lightheadedness came back.  Sometimes he uses the words headaches and dizziness interchangeably but overall he does state that headaches are better and "off balance" is the biggest issue.  The dizziness is all day.    I had him try and hold the levodopa for a few days but he was only able to hold it for 2 days and then stated that the pain over his whole body was so bad that he restarted the medication.  He didn't think perhaps his complaints of lightheadedness/headache were better for the 2 days that he was off of the medication, but has some difficulty telling if this is the case.  09/16/16 update: The patient follows up today.  I changed him last visit to carbidopa/levodopa 25/100, 3 tablets at 9 AM/noon/3 PM and carbidopa/levodopa 50/200 at bedtime.  He states that ***.  Pt denies falls.  In regards to lightheadedness, he states ***.    No hallucinations.  Mood has been good.  Neuroimaging has previously been performed.  It is not available for my review  today.  PREVIOUS MEDICATIONS: Sinemet  ALLERGIES:  No Known Allergies  CURRENT MEDICATIONS:  Outpatient Encounter Prescriptions as of 09/18/2016  Medication Sig  . amLODipine (NORVASC) 10 MG tablet Take 10 mg by mouth daily.  Marland Kitchen aspirin EC 81 MG tablet Take 81 mg by mouth daily.  Marland Kitchen atorvastatin (LIPITOR) 20 MG tablet Take 20 mg by mouth at bedtime.   . carbidopa-levodopa (SINEMET CR) 50-200 MG tablet Take 1 tablet by mouth at bedtime.  . carbidopa-levodopa (SINEMET CR) 50-200 MG tablet TAKE  1 TABLET BY MOUTH AT BEDTIME.  . Carbidopa-Levodopa ER (SINEMET CR) 25-100 MG tablet controlled release Take 3 tablets by mouth 3 (three) times daily.  . colchicine 0.6 MG tablet Take 0.6 mg by mouth every 8 (eight) hours as needed (for gout flares).  . gabapentin (NEURONTIN) 300 MG capsule Take 600 mg by mouth at bedtime.  . quinapril (ACCUPRIL) 20 MG tablet Take 20 mg by mouth daily.  . sitaGLIPtin (JANUVIA) 50 MG tablet Take 50 mg by mouth daily.  Marland Kitchen. zolpidem (AMBIEN) 10 MG tablet Take 10 mg by mouth at bedtime as needed for sleep.   No facility-administered encounter medications on file as of 09/18/2016.     PAST MEDICAL HISTORY:   Past Medical History:  Diagnosis Date  . CAD (coronary artery disease)    [place] in [place]; First diagonal  . Diabetes (HCC)   . Hyperlipidemia   . Hypertension   . Parkinson's disease (HCC)     PAST SURGICAL HISTORY:   Past Surgical History:  Procedure Laterality Date  . CORONARY ANGIOPLASTY WITH STENT PLACEMENT      SOCIAL HISTORY:   Social History   Social History  . Marital status: Single    Spouse name: N/A  . Number of children: N/A  . Years of education: N/A   Occupational History  . Not on file.   Social History Main Topics  . Smoking status: Never Smoker  . Smokeless tobacco: Never Used  . Alcohol use No  . Drug use: No  . Sexual activity: Not on file   Other Topics Concern  . Not on file   Social History Narrative  . No  narrative on file    FAMILY HISTORY:   Family Status  Relation Status  . Mother Deceased   alzheimer's  . Father Deceased   HTN  . Sister Deceased   x3    ROS:  A complete 10 system review of systems was obtained and was unremarkable apart from what is mentioned above.  PHYSICAL EXAMINATION:    VITALS:   There were no vitals filed for this visit. No data found.    GEN:  The patient appears stated age and is in NAD. HEENT:  Normocephalic, atraumatic.  The mucous membranes are moist. The superficial temporal arteries are without ropiness or tenderness. CV:  RRR Lungs:  CTAB Neck/HEME:  There are no carotid bruits bilaterally.  Neurological examination:  Orientation:  Montreal Cognitive Assessment  06/28/2015  Visuospatial/ Executive (0/5) 1  Naming (0/3) 2  Attention: Read list of digits (0/2) 1  Attention: Read list of letters (0/1) 1  Attention: Serial 7 subtraction starting at 100 (0/3) 3  Language: Repeat phrase (0/2) 2  Language : Fluency (0/1) 1  Abstraction (0/2) 2  Delayed Recall (0/5) 0  Orientation (0/6) 5  Total 18  Adjusted Score (based on education) 19   Cranial nerves: There is good facial symmetry. There is facial hypomimia.  Pupils are equal round and reactive to light bilaterally. Fundoscopic exam reveals clear margins bilaterally. Extraocular muscles are intact. There is poor smooth pursuit.  The visual fields are full to confrontational testing. The speech is fluent and clear.  He is very hypophonic.   Soft palate rises symmetrically and there is no tongue deviation. Hearing is intact to conversational tone. Sensation: Sensation is intact to light touch throughout. Motor: Strength is 5/5 in the bilateral upper and lower extremities.   Shoulder shrug is equal and symmetric.  There is  no pronator drift.   Movement examination: Tone: There is modincreased tone bilaterally today in the UE Abnormal movements: no dyskinesia today Coordination:  There  is decremation with RAM's, with all form of RAMS, including alternating supination and pronation of the forearm, hand opening and closing, finger taps, heel taps and toe taps, right more than left and UE more than LE. Gait and Station: The patient has no difficulty arising out of a deep-seated chair without the use of the hands; in fact, he cannot do it without pushing off.  Shuffles.  Slow to walk.  Turns en bloc.  Start hesitation.  ASSESSMENT/PLAN:  1.  Parkinsonism.  I suspect that this does represent idiopathic Parkinson's disease.  The patient has tremor, bradykinesia, rigidity and postural instability.  -He is complaining about headache and dizziness and uses these terms interchangeably.  I am having difficulty figuring out which is really the problem, but think that it is lightheadedness.  I had him hold his levodopa for a few days and he thought perhaps the symptom was better, so I wonder if it was not lightheadedness.  I would like to change him to [place], but he is very difficult to get a hold of on the phone, and this is really something that I need to titrate with samples and then work with the drug company and his insurance on cost.  I am not sure that we would be able to accomplish this with him, so I first will try the older, extended release carbidopa/levodopa 25/100 CR, 3 tablets at 9 AM/noon/3 PM.  He is much underdosed so I increased the medication and accounted for the fact that CR is 30% less effective than IR.  He will continue carbidopa/levodopa 50/200 at bedtime.   -discussed duopa but patient not interested  -encouraged pt to add CV exercise.  Has stationary bike where he lives.   2.  Parkinson's disease dementia  -He is living at assisted living.  I would like to see them manage meds but he is doing that for now. 3.  Constipation  -This is frequently associated with Parkinson's disease.  Has rancho recipe 4.  Near syncope  -seems to be doing well in this regard.   5.   Diabetic peripheral neuropathy  -Safety was discussed.  -The patient is on gabapentin, 600 mg at night.  He tells me that this is primarily for sleep.   6.  Headache  -STA without ropiness or tenderness.  No concerning features.   -tried to raise gabapentin to 300/600 but not sure that he did this 7.  Follow-up in the next 8 weeks, sooner should new neurologic issues arise.  Much greater than 50% of this visit was spent in counseling and coordinating care.  Total face to face time:  30 min

## 2016-09-18 ENCOUNTER — Ambulatory Visit: Payer: Medicare Other | Admitting: Neurology

## 2016-09-29 ENCOUNTER — Encounter (HOSPITAL_COMMUNITY): Payer: Self-pay

## 2016-09-29 ENCOUNTER — Emergency Department (HOSPITAL_COMMUNITY): Payer: Federal, State, Local not specified - PPO

## 2016-09-29 ENCOUNTER — Emergency Department (HOSPITAL_COMMUNITY)
Admission: EM | Admit: 2016-09-29 | Discharge: 2016-09-29 | Disposition: A | Discharge status: Home / Self Care | Payer: Federal, State, Local not specified - PPO | Attending: Emergency Medicine | Admitting: Emergency Medicine

## 2016-09-29 DIAGNOSIS — Z7982 Long term (current) use of aspirin: Secondary | ICD-10-CM | POA: Insufficient documentation

## 2016-09-29 DIAGNOSIS — Z79899 Other long term (current) drug therapy: Secondary | ICD-10-CM | POA: Diagnosis not present

## 2016-09-29 DIAGNOSIS — R079 Chest pain, unspecified: Secondary | ICD-10-CM

## 2016-09-29 DIAGNOSIS — I2510 Atherosclerotic heart disease of native coronary artery without angina pectoris: Secondary | ICD-10-CM | POA: Diagnosis not present

## 2016-09-29 DIAGNOSIS — G20 Parkinson's disease: Secondary | ICD-10-CM | POA: Insufficient documentation

## 2016-09-29 DIAGNOSIS — I10 Essential (primary) hypertension: Secondary | ICD-10-CM | POA: Diagnosis not present

## 2016-09-29 DIAGNOSIS — Z7984 Long term (current) use of oral hypoglycemic drugs: Secondary | ICD-10-CM | POA: Insufficient documentation

## 2016-09-29 DIAGNOSIS — Z955 Presence of coronary angioplasty implant and graft: Secondary | ICD-10-CM | POA: Diagnosis not present

## 2016-09-29 DIAGNOSIS — E119 Type 2 diabetes mellitus without complications: Secondary | ICD-10-CM | POA: Insufficient documentation

## 2016-09-29 LAB — CBC
HCT: 38 % — ABNORMAL LOW (ref 39.0–52.0)
Hemoglobin: 12.2 g/dL — ABNORMAL LOW (ref 13.0–17.0)
MCH: 33.2 pg (ref 26.0–34.0)
MCHC: 32.1 g/dL (ref 30.0–36.0)
MCV: 103.3 fL — AB (ref 78.0–100.0)
PLATELETS: 224 10*3/uL (ref 150–400)
RBC: 3.68 MIL/uL — ABNORMAL LOW (ref 4.22–5.81)
RDW: 12.8 % (ref 11.5–15.5)
WBC: 4 10*3/uL (ref 4.0–10.5)

## 2016-09-29 LAB — BASIC METABOLIC PANEL
Anion gap: 5 (ref 5–15)
BUN: 13 mg/dL (ref 6–20)
CALCIUM: 9.2 mg/dL (ref 8.9–10.3)
CHLORIDE: 107 mmol/L (ref 101–111)
CO2: 28 mmol/L (ref 22–32)
CREATININE: 1 mg/dL (ref 0.61–1.24)
GFR calc Af Amer: >60 mL/min (ref >60)
GFR calc non Af Amer: >60 mL/min (ref >60)
GLUCOSE: 133 mg/dL — AB (ref 65–99)
Potassium: 4.4 mmol/L (ref 3.5–5.1)
Sodium: 140 mmol/L (ref 135–145)

## 2016-09-29 LAB — TROPONIN I: Troponin I: <0.03 ng/mL (ref <0.03)

## 2016-09-29 LAB — I-STAT TROPONIN, ED: TROPONIN I, POC: 0 ng/mL (ref 0.00–0.08)

## 2016-09-29 NOTE — ED Triage Notes (Signed)
Per [facility]: pt has intermittent chest pain X 2 days, with 2 or 3 episodes each day. Rates pain 6/10, but then the pain goes away. Pt stated that the chest pain had resolved by the time EMS arrived, but that this was the 3rd episode today. Pt denies nausea and shortness of breath. Pt has had stents placed within the last 2 years.   EMS attempted to get orthostatics on the pt but when they tried to stand him the pt stated that he felt dizzy.   Pt took 324 ASA prior to EMS arrival.

## 2016-09-29 NOTE — ED Provider Notes (Signed)
[facility]-EMERGENCY DEPT Provider Note   CSN: [id number] Arrival date & time: 09/29/16  1521     History   Chief Complaint Chief Complaint  Patient presents with  . Chest Pain    HPI Gabriel Ramirez is a 71 y.o. male.  HPI patient presents with sharp chest pain for last 2-3 days. It'll come on a couple times a day and hit him in his left lower chest. Lasts for around 2 seconds. He had 3 times a day. No nausea shortness of breath. No trouble breathing. Does not want on exertion. Has some dizziness but he has chronic dizziness due to his Parkinson's. It does not feel like when he had his stents.   Past Medical History:  Diagnosis Date  . CAD (coronary artery disease)    Stent in [place]; First diagonal  . Diabetes (HCC)   . Hyperlipidemia   . Hypertension   . Parkinson's disease [facility])     Patient Active Problem List   Diagnosis Date Noted  . CAD (coronary artery disease) 08/31/2015  . Essential hypertension 08/31/2015  . Hyperlipidemia 08/31/2015  . Parkinson's disease (HCC) 08/31/2015    Past Surgical History:  Procedure Laterality Date  . CORONARY ANGIOPLASTY WITH STENT PLACEMENT         Home Medications    Prior to Admission medications   Medication Sig Start Date End Date Taking? Authorizing Provider  amLODipine (NORVASC) 5 MG tablet Take 5 mg by mouth daily. 08/29/16  Yes Historical Provider, MD  aspirin EC 81 MG tablet Take 81 mg by mouth daily.   Yes Historical Provider, MD  atorvastatin (LIPITOR) 20 MG tablet Take 20 mg by mouth at bedtime.    Yes Historical Provider, MD  carbidopa-levodopa (SINEMET CR) 50-200 MG tablet TAKE 1 TABLET BY MOUTH AT BEDTIME. 08/01/16  Yes Rebecca S Tat, DO  Carbidopa-Levodopa ER (SINEMET CR) 25-100 MG tablet controlled release Take 3 tablets by mouth 3 (three) times daily. Patient taking differently: Take 2 tablets by mouth See admin instructions. Take 2 tablets by mouth every morning and after lunch 07/24/16  Yes Rebecca S Tat, DO   colchicine 0.6 MG tablet Take 0.6 mg by mouth every 8 (eight) hours as needed (for gout flares).   Yes Historical Provider, MD  gabapentin (NEURONTIN) 300 MG capsule Take 600 mg by mouth at bedtime.   Yes Historical Provider, MD  Oxymetazoline HCl (VICKS SINEX NA) Place 1 spray into both nostrils daily as needed (congestion).   Yes Historical Provider, MD  quinapril (ACCUPRIL) 20 MG tablet Take 20 mg by mouth at bedtime.    Yes Historical Provider, MD  sitaGLIPtin (JANUVIA) 50 MG tablet Take 50 mg by mouth daily.   Yes Historical Provider, MD  zolpidem (AMBIEN) 10 MG tablet Take 10 mg by mouth at bedtime as needed for sleep.   Yes Historical Provider, MD    Family History Family History  Problem Relation Age of Onset  . Alzheimer's disease Mother   . Hypertension Father     Social History Social History  Substance Use Topics  . Smoking status: Never Smoker  . Smokeless tobacco: Never Used  . Alcohol use No     Allergies   Patient has no known allergies.   Review of Systems Review of Systems  Constitutional: Negative for appetite change.  Respiratory: Negative for chest tightness.   Cardiovascular: Positive for chest pain.  Gastrointestinal: Negative for abdominal distention.  Genitourinary: Negative for dysuria.  Musculoskeletal: Negative for back pain.  Skin: Negative for rash.  Neurological: Positive for dizziness, tremors and weakness.     Physical Exam Updated Vital Signs BP (!) 148/101   Pulse 92   Temp 98.5 F (36.9 C) (Oral)   Resp 10   Ht 6\' 3"  (1.905 m)   Wt 205 lb (93 kg)   SpO2 100%   BMI 25.62 kg/m   Physical Exam  Constitutional: He appears well-developed.  HENT:  Head: Atraumatic.  Eyes: EOM are normal.  Cardiovascular: Normal rate.   Pulmonary/Chest: Effort normal. He exhibits no tenderness.  Abdominal: Soft. There is no guarding.  Musculoskeletal: He exhibits no tenderness.  Neurological: He is alert.  Tremor to his extremities.    Skin: Skin is warm.     ED Treatments / Results  Labs (all labs ordered are listed, but only abnormal results are displayed) Labs Reviewed  BASIC METABOLIC PANEL - Abnormal; Notable for the following:       Result Value   Glucose, Bld 133 (*)    All other components within normal limits  CBC - Abnormal; Notable for the following:    RBC 3.68 (*)    Hemoglobin 12.2 (*)    HCT 38.0 (*)    MCV 103.3 (*)    All other components within normal limits  TROPONIN I  I-STAT TROPOININ, ED    EKG  EKG Interpretation  Date/Time:  Monday September 29 2016 15:47:55 EST Ventricular Rate:  77 PR Interval:    QRS Duration: 81 QT Interval:  366 QTC Calculation: 415 R Axis:   -23 Text Interpretation:  Sinus rhythm Borderline left axis deviation Consider anterior infarct Baseline wander in lead(s) I III aVL No significant change since last tracing Confirmed by Rubin PayorPICKERING  MD, Lorieann Argueta [phone]([id number]) on 09/29/2016 4:51:16 PM       Radiology Dg Chest 2 View  Result Date: 09/29/2016 CLINICAL DATA:  Chest pain. EXAM: CHEST  2 VIEW COMPARISON:  None. FINDINGS: The heart size and mediastinal contours are within normal limits. Both lungs are clear. No pneumothorax or pleural effusion is noted. Small hiatal hernia is noted. The visualized skeletal structures are unremarkable. IMPRESSION: No active cardiopulmonary disease.  Small hiatal hernia. Electronically Signed   By: Lupita RaiderJames  Green Jr, M.D.   On: 09/29/2016 16:47    Procedures Procedures (including critical care time)  Medications Ordered in ED Medications - No data to display   Initial Impression / Assessment and Plan / ED Course  I have reviewed the triage vital signs and the nursing notes.  Pertinent labs & imaging results that were available during my care of the patient were reviewed by me and considered in my medical decision making (see chart for details).  Clinical Course   Patient with chest pain. Low risk with story of it lasting only 2-3  seconds. EKG and lab work reassuring. X-ray reassuring. I went to discuss the patient will discharge and the person that the patient stated that the patient's been having trouble walking. Patient did not tell me anything about this to start with and that he was here for chest pain. Now states that his been having trouble walking for last week. He normally walks pretty well. Has had some changes medications recently. States he attempted to go up on them became nauseous. Will attempt to ambulate here.    Patient was able ambulate. Chest pain likely not significant at this time. May need adjustment of medications by his neurologist. Discharge. Final Clinical Impressions(s) / ED Diagnoses  Final diagnoses:  Nonspecific chest pain  Parkinson's disease [facility])    New Prescriptions New Prescriptions   No medications on file     Benjiman Core, MD 09/29/16 [phone]

## 2016-09-29 NOTE — ED Notes (Signed)
Pt ambulated in hallway with walker and 1 stand-by assist; tolerated well

## 2016-10-01 ENCOUNTER — Telehealth: Payer: Self-pay | Admitting: Neurology

## 2016-10-01 NOTE — Telephone Encounter (Signed)
Received note from after hours call center that patient wanted an appt ASAP.  He went to ER yesterday for chest pain and trouble walking. Reviewed notes from ED and looks like same complaints he has had in the past.  Patient has an appt on 10/30/16 to see you.  He cancelled appts on 09/04/16, 09/11/16 and 09/18/16 to see you.

## 2016-10-01 NOTE — Telephone Encounter (Signed)
At this point, I don't have work in before then (I believe those appts he n/s or cx were work in appts).  If emergent, can f/u with PCP.

## 2016-10-02 ENCOUNTER — Telehealth: Payer: Self-pay | Admitting: Neurology

## 2016-10-02 NOTE — Telephone Encounter (Signed)
Patient states he can't walk. Same complaints as he has had. Patient was seen in the ED a few days ago with same complaints. Missed three follow up appts in December. Aware we can not treat him over the phone and Dr. Arbutus Leasat would need to see him in follow up to access his condition.  Aware if he believes this is emergent and can not wait til his follow up with Dr. Arbutus Leasat he needs to seek care with his PCP or urgent care.

## 2016-10-02 NOTE — Telephone Encounter (Signed)
Pt called said it is very difficult for him to walk.  Got worse yesterday, has been have difficulty for a while now.  Would like a call.

## 2016-10-29 NOTE — Progress Notes (Addendum)
Gabriel Ramirez was seen today in the movement disorders clinic for neurologic consultation at the request of Lupe Carneyean Mitchell, MD.   The patient presents today to discuss his parkinsonism.  No one accompanies him to the visit (family is in the waiting room but he refused to let them back to the appointment).   Limited prior neurology records are available to me and the faxed copy that is available to me is virtually unreadable.  He was previously taken care of in [place].  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  Pt states that he thinks that he was dx in 2012.  He states that his first symptom was that he was "slowing down" and getting "lightheaded."  Over time, he has had some right hand tremor (rarely); he is right hand dominant.  He is on carbidopa/levodopa 25/100, 2 po tid; states that it was increased in May but cannot remember what he was taking before the increase.  He has trouble telling me if the increase helped.  He takes the medication at 9:30 am/4pm/8-9pm (goes to bed about 11 pm).  He admits that he is sleeping a lot during the day.    08/30/15 update:  The patient returns today for follow-up.  He is on carbidopa/levodopa 25/100, 2 tablets 3 times per day (9 AM/1 PM/6 p.m.) and last visit we added carbidopa/levodopa 50/200 at night.  He states that it helped morning stiffness.  I referred him to [facility] for home physical and speech therapy.   The therapy has helped.  Since our last visit, the patient denies any falls.  He denies hallucinations.    I was able to get a copy of his MRI of the brain since our last visit, but it was a very difficult to read fax.  I am unsure if it was dated 12/17/2013 or 12/18/2014.  Regardless, the report stated that it showed chronic microvascular changes and an old lacune in the right frontal region.  I have tried multiple times to get records from [place] and no records were sent.  He has  not been exercising.  He initially reports headache, but upon further questioning he actually has no head pain at all and is trying to tell me that he is having near syncopal episodes after he eats lunch every day.  He tells me that this is the same feeling that he had before he had to have his cardiac stent placed.  He had a cardiologist when he lived in [place], but has not yet established here.  He denies any chest pain.  He states that he does not think that the episodes are associated with taking his noon medication as he had these episodes years ago before he started taking levodopa.  He also c/o constipation.  10/25/15 update:  The patient is following up today.  Last visit, his levodopa was slightly increased.  He is supposed to be taking carbidopa/levodopa 25/100, 2 tablets at 9 AM, noon, 3 PM, and one tablet at 7 PM in addition to carbidopa/levodopa 50/200 at night.  He did not add that one at 7 last visit like he was supposed to.  He doesn't remember us talking about it.  He has seen cardiology since our last visit.  I reviewed Dr. Ludwig Clarksrenshaw's records.  He is taking a wait and see approach in deciding on whether or not to decrease his blood pressure medications.  The  patient has described some lightheadedness, but he has not had any syncopal episodes.  He is on gabapentin, 600 mg at night for diabetic peripheral neuropathy.  He c/o nausea today.  It doesn't seem to come at any particular time.  He states that it used to be "every once in a while" but now it is more regular.      03/28/16 update:  The patient is following up today.  He is taking carbidopa/levodopa 25/100, 2 tablets at 9 AM, noon, 3 PM, and carbidopa/levodopa 50/200 at night.  A higher dosage has been recommended, but he really has not wanted to go up on the medication.  He is riding stationary bike 2-3 times a week.   He denies any falls since last visit.  The patient has described some lightheadedness, but he has not had any syncopal  episodes.  He is on gabapentin, 600 mg at night for diabetic peripheral neuropathy.  Having headaches, mostly facial.  Coming daily.  Cannot describe quality to me.  May take advil to relieve and does that one time a day.  Has no photophobia and no phonophobia.  Been going on about a month  07/24/16 update:  Pt f/u today.  He is on carbidopa/levodopa 25/100, 2 tablets at 9 AM/noon/3 PM and carbidopa/levodopa 50/200 at bedtime.  Exercising on stationary bike a few days a week.  No falls.  No hallucination.  In ED on 07/08/16 with c/o HA and lightheadedness.  This has been a chronic c/o for him.  Work up was negative. Last visit, I increased his gabapentin to 300 mg in AM and 600 mg in PM to see if that would help with headache.  It doesn't appear that he did this but when we called him to follow up he stated that his headache was better and lightheadedness got better as well. States today that only on gabapentin at night.  However, the lightheadedness came back.  Sometimes he uses the words headaches and dizziness interchangeably but overall he does state that headaches are better and "off balance" is the biggest issue.  The dizziness is all day.    I had him try and hold the levodopa for a few days but he was only able to hold it for 2 days and then stated that the pain over his whole body was so bad that he restarted the medication.  He didn't think perhaps his complaints of lightheadedness/headache were better for the 2 days that he was off of the medication, but has some difficulty telling if this is the case.  10/30/16 update:  The patient follows up today.  I switched him from immediate release levodopa last visit to carbidopa/levodopa 25/100 CR and had him take 3 tablets at 9 AM/3 tablets at noon and 3 tablets at 3 PM.  I asked him to continue the carbidopa/levodopa 50/200 at bedtime.  We did this primarily because of complaints of dizziness.  He went to the emergency room in January because of chest pain.   While there, he complained about difficulty walking and dizziness.  He did call me and try to get in in January, but no appointments were available (he canceled or missed 3 appointments with me in December).  Pt denies dizziness today and reports that dizziness hasn't been a big issue, despite what had been reported.  States that his problem occurred when a home health aid told him to back down on his carbidopa/levodopa 25/100 CR from 3 po tid to 2 po  tid and he started not feeling well after that.  Reports that he was doing well before that.  States that she was telling him to go up and down on the dosages and he didn't feel well.  He actually stopped it for a short period of time.  Once he got back up on carbidopa/levodopa 25/100 CR 3 po tid he felt much better and denies any real sense of dizziness.  He is exercising faithfully and thinks that has been really helpful.  Works out with trainer 2 days a week and does other exercises himself every other day of the week.  No falls.    Neuroimaging has previously been performed.  It is not available for my review today.  PREVIOUS MEDICATIONS: Sinemet  ALLERGIES:  No Known Allergies  CURRENT MEDICATIONS:  Outpatient Encounter Prescriptions as of 10/30/2016  Medication Sig  . amLODipine (NORVASC) 5 MG tablet Take 5 mg by mouth daily.  Marland Kitchen aspirin EC 81 MG tablet Take 81 mg by mouth daily.  Marland Kitchen atorvastatin (LIPITOR) 20 MG tablet Take 20 mg by mouth at bedtime.   . carbidopa-levodopa (SINEMET CR) 50-200 MG tablet TAKE 1 TABLET BY MOUTH AT BEDTIME.  . Carbidopa-Levodopa ER (SINEMET CR) 25-100 MG tablet controlled release Take 3 tablets by mouth 3 (three) times daily.  Marland Kitchen gabapentin (NEURONTIN) 300 MG capsule Take 600 mg by mouth at bedtime.  . Oxymetazoline HCl (VICKS SINEX NA) Place 1 spray into both nostrils daily as needed (congestion).  . quinapril (ACCUPRIL) 20 MG tablet Take 20 mg by mouth at bedtime.   . sitaGLIPtin (JANUVIA) 50 MG tablet Take 50 mg by  mouth daily.  Marland Kitchen zolpidem (AMBIEN) 10 MG tablet Take 10 mg by mouth at bedtime as needed for sleep.  . [DISCONTINUED] Carbidopa-Levodopa ER (SINEMET CR) 25-100 MG tablet controlled release Take 3 tablets by mouth 3 (three) times daily. (Patient taking differently: Take 2 tablets by mouth See admin instructions. Take 2 tablets by mouth every morning and after lunch)  . colchicine 0.6 MG tablet Take 0.6 mg by mouth every 8 (eight) hours as needed (for gout flares).  . Elastic Bandages & Supports (ABDOMINAL BINDER/ELASTIC LARGE) MISC 1 Device by Does not apply route daily. DX: i95.1   No facility-administered encounter medications on file as of 10/30/2016.     PAST MEDICAL HISTORY:   Past Medical History:  Diagnosis Date  . CAD (coronary artery disease)    Stent in [place]; [place]  . Diabetes (HCC)   . Hyperlipidemia   . Hypertension   . Parkinson's disease (HCC)     PAST SURGICAL HISTORY:   Past Surgical History:  Procedure Laterality Date  . CORONARY ANGIOPLASTY WITH STENT PLACEMENT      SOCIAL HISTORY:   Social History   Social History  . Marital status: Single    Spouse name: N/A  . Number of children: N/A  . Years of education: N/A   Occupational History  . Not on file.   Social History Main Topics  . Smoking status: Never Smoker  . Smokeless tobacco: Never Used  . Alcohol use No  . Drug use: No  . Sexual activity: Not on file   Other Topics Concern  . Not on file   Social History Narrative  . No narrative on file    FAMILY HISTORY:   Family Status  Relation Status  . Mother Deceased   alzheimer's  . Father Deceased   HTN  . Sister Deceased  x3    ROS:  A complete 10 system review of systems was obtained and was unremarkable apart from what is mentioned above.  PHYSICAL EXAMINATION:    VITALS:   Vitals:   10/30/16 1058  Weight: 209 lb (94.8 kg)  Height: 6\' 3"  (1.905 m)   Orthostatic VS for the past 24 hrs (Last 3 readings):  BP- Lying  Pulse- Lying BP- Sitting Pulse- Sitting BP- Standing at 0 minutes Pulse- Standing at 0 minutes  10/30/16 1058 136/64 81 110/66 85 98/58 88     GEN:  The patient appears stated age and is in NAD. HEENT:  Normocephalic, atraumatic.  The mucous membranes are moist. The superficial temporal arteries are without ropiness or tenderness. CV:  RRR Lungs:  CTAB Neck/HEME:  There are no carotid bruits bilaterally.  Neurological examination:  Orientation:  Montreal Cognitive Assessment  10/30/2016 06/28/2015  Visuospatial/ Executive (0/5) 0 1  Naming (0/3) 2 2  Attention: Read list of digits (0/2) 2 1  Attention: Read list of letters (0/1) 1 1  Attention: Serial 7 subtraction starting at 100 (0/3) 3 3  Language: Repeat phrase (0/2) 2 2  Language : Fluency (0/1) 1 1  Abstraction (0/2) 2 2  Delayed Recall (0/5) 2 0  Orientation (0/6) 6 5  Total 21 18  Adjusted Score (based on education) 22 19   Cranial nerves: There is good facial symmetry. There is facial hypomimia.  Pupils are equal round and reactive to light bilaterally. Fundoscopic exam reveals clear margins bilaterally. Extraocular muscles are intact. There is poor smooth pursuit.  The visual fields are full to confrontational testing. The speech is fluent and clear.  He is very hypophonic.   Soft palate rises symmetrically and there is no tongue deviation. Hearing is intact to conversational tone. Sensation: Sensation is intact to light touch throughout. Motor: Strength is 5/5 in the bilateral upper and lower extremities.   Shoulder shrug is equal and symmetric.  There is no pronator drift.   Movement examination: Tone: There is mild-mod increased tone bilaterally today in the UE Abnormal movements: no dyskinesia today Coordination:  There is decremation with RAM's, with all form of RAMS, including alternating supination and pronation of the forearm, hand opening and closing, finger taps, heel taps and toe taps, right more than left and UE  more than LE. Gait and Station: The patient has no difficulty arising out of a deep-seated chair without the use of the hands; in fact, he cannot do it without pushing off.  Shuffles.  Slow to walk.  Turns en bloc.  Start hesitation.  ADDENDUM LABS:  Patient had lab work on 12/04/2016 3 sodium was 142, potassium 4.4, chloride 106, CO2 28, BUN 23, creatinine 1.01 and glucose 120.  AST was 14, ALT 10 and alkaline phosphatase 87.  ASSESSMENT/PLAN:  1.  Parkinsonism.  I suspect that this does represent idiopathic Parkinson's disease.  The patient has tremor, bradykinesia, rigidity and postural instability.  -continue carbidopa/levodopa 25/100 CR, 3 tablets at 9 AM/noon/3 PM.    He will continue carbidopa/levodopa 50/200 at bedtime.   -discussed duopa but patient not interested  -Congratulated him on the exercises that he is doing.  He is feeling much better. 2.  Parkinson's disease dementia  -He is living at assisted living.  I would like to see them manage meds but he is doing that for now.  His MoCA actually improved. 3.  Constipation  -This is frequently associated with Parkinson's disease.  Has rancho  recipe 4.  Orthostatic hypotension  -he was orthostatic in the office today but laying BP and even sitting too high to make me want to put him on med.   I wonder if he still needs this norvasc.  Called his primary care office, but had to leave a message.  Patient denies dizziness today, despite several messages about this since our last visit.  He denied needing or wanting an abdominal compression binder.  We will continue to monitor closely.  Encouraged hydration. 5.  Diabetic peripheral neuropathy  -Safety was discussed.  -The patient is on gabapentin, 600 mg at night.  He tells me that this is primarily for sleep.   6.  Headache  -STA without ropiness or tenderness.  No concerning features.   -tried to raise gabapentin to 300/600 but not sure that he did this 7.  Follow-up in the next 3-4  months, sooner should new neurologic issues arise.  Much greater than 50% of this visit was spent in counseling and coordinating care.  Total face to face time:  30 min

## 2016-10-30 ENCOUNTER — Encounter: Payer: Self-pay | Admitting: Neurology

## 2016-10-30 ENCOUNTER — Ambulatory Visit (INDEPENDENT_AMBULATORY_CARE_PROVIDER_SITE_OTHER): Payer: Federal, State, Local not specified - PPO | Admitting: Neurology

## 2016-10-30 VITALS — Ht 75.0 in | Wt 209.0 lb

## 2016-10-30 DIAGNOSIS — F0280 Dementia in other diseases classified elsewhere without behavioral disturbance: Secondary | ICD-10-CM

## 2016-10-30 DIAGNOSIS — I951 Orthostatic hypotension: Secondary | ICD-10-CM | POA: Diagnosis not present

## 2016-10-30 DIAGNOSIS — G20 Parkinson's disease: Secondary | ICD-10-CM | POA: Diagnosis not present

## 2016-10-30 MED ORDER — ABDOMINAL BINDER/ELASTIC LARGE MISC: 1.0000 | Freq: Every day | 0 refills | Status: DC

## 2016-10-30 MED ORDER — CARBIDOPA-LEVODOPA ER 25-100 MG PO TBCR: 3.0000 | EXTENDED_RELEASE_TABLET | Freq: Three times a day (TID) | ORAL | 2 refills | Status: DC

## 2017-02-18 ENCOUNTER — Other Ambulatory Visit: Payer: Self-pay | Admitting: Neurology

## 2017-02-24 ENCOUNTER — Ambulatory Visit: Payer: Medicare Other | Admitting: Neurology

## 2017-03-05 ENCOUNTER — Ambulatory Visit: Payer: Medicare Other | Admitting: Neurology

## 2017-03-10 ENCOUNTER — Telehealth: Payer: Self-pay | Admitting: Neurology

## 2017-03-10 NOTE — Telephone Encounter (Signed)
Caller: [place]  Urgent? Yes  Reason for the call: He said the Carbidopa Levodopa that he has been taking has been making him feel very sick. Please call. Thanks

## 2017-03-10 NOTE — Telephone Encounter (Signed)
Given him zofran, 4 mg to take in the AM for a week and see if that helps.  Give him #[phone]

## 2017-03-10 NOTE — Telephone Encounter (Signed)
Spoke with patient. He states for the past week he has had nausea every time he takes his Levodopa. He has not had this in the past. He denies any other changes in other medications or his diet. He states he does not take medication on an empty stomach. I advised it would be strange for him to develop nausea from medication since he has been on it so long, but he states nausea only starts after his doses of medication. He has a follow up appt on 03/19/17. Please advise.

## 2017-03-11 MED ORDER — ONDANSETRON HCL 4 MG PO TABS: 4.0000 mg | ORAL_TABLET | ORAL | 0 refills | Status: DC

## 2017-03-11 NOTE — Telephone Encounter (Signed)
Patient made aware RX sent.  

## 2017-03-16 NOTE — Progress Notes (Signed)
Gabriel Ramirez was seen today in the movement disorders clinic for neurologic consultation at the request of Alroy Dust, L.Marlou Sa, MD.   The patient presents today to discuss his parkinsonism.  No one accompanies him to the visit (family is in the waiting room but he refused to let them back to the appointment).   Limited prior neurology records are available to me and the faxed copy that is available to me is virtually unreadable.  He was previously taken care of in [place].  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  Pt states that he thinks that he was dx in 2012.  He states that his first symptom was that he was "slowing down" and getting "lightheaded."  Over time, he has had some right hand tremor (rarely); he is right hand dominant.  He is on carbidopa/levodopa 25/100, 2 po tid; states that it was increased in May but cannot remember what he was taking before the increase.  He has trouble telling me if the increase helped.  He takes the medication at 9:30 am/4pm/8-9pm (goes to bed about 11 pm).  He admits that he is sleeping a lot during the day.    08/30/15 update:  The patient returns today for follow-up.  He is on carbidopa/levodopa 25/100, 2 tablets 3 times per day (9 AM/1 PM/6 p.m.) and last visit we added carbidopa/levodopa 50/200 at night.  He states that it helped morning stiffness.  I referred him to [facility] [place] [facility] for home physical and speech therapy.   The therapy has helped.  Since our last visit, the patient denies any falls.  He denies hallucinations.    I was able to get a copy of his MRI of the brain since our last visit, but it was a very difficult to read fax.  I am unsure if it was dated 12/17/2013 or 12/18/2014.  Regardless, the report stated that it showed chronic microvascular changes and an old lacune in the right frontal region.  I have tried multiple times to get records from [place] and no records were sent.  He  has not been exercising.  He initially reports headache, but upon further questioning he actually has no head pain at all and is trying to tell me that he is having near syncopal episodes after he eats lunch every day.  He tells me that this is the same feeling that he had before he had to have his cardiac stent placed.  He had a cardiologist when he lived in [place], but has not yet established here.  He denies any chest pain.  He states that he does not think that the episodes are associated with taking his noon medication as he had these episodes years ago before he started taking levodopa.  He also c/o constipation.  10/25/15 update:  The patient is following up today.  Last visit, his levodopa was slightly increased.  He is supposed to be taking carbidopa/levodopa 25/100, 2 tablets at 9 AM, noon, 3 PM, and one tablet at 7 PM in addition to carbidopa/levodopa 50/200 at night.  He did not add that one at 7 last visit like he was supposed to.  He doesn't remember [place] talking about it.  He has seen cardiology since our last visit.  I reviewed Dr. Jacalyn Lefevre records.  He is taking a wait and see approach in deciding on whether or not to decrease his blood pressure medications.  The  patient has described some lightheadedness, but he has not had any syncopal episodes.  He is on gabapentin, 600 mg at night for diabetic peripheral neuropathy.  He c/o nausea today.  It doesn't seem to come at any particular time.  He states that it used to be "every once in a while" but now it is more regular.      03/28/16 update:  The patient is following up today.  He is taking carbidopa/levodopa 25/100, 2 tablets at 9 AM, noon, 3 PM, and carbidopa/levodopa 50/200 at night.  A higher dosage has been recommended, but he really has not wanted to go up on the medication.  He is riding stationary bike 2-3 times a week.   He denies any falls since last visit.  The patient has described some lightheadedness, but he has not had any  syncopal episodes.  He is on gabapentin, 600 mg at night for diabetic peripheral neuropathy.  Having headaches, mostly facial.  Coming daily.  Cannot describe quality to me.  May take advil to relieve and does that one time a day.  Has no photophobia and no phonophobia.  Been going on about a month  07/24/16 update:  Pt f/u today.  He is on carbidopa/levodopa 25/100, 2 tablets at 9 AM/noon/3 PM and carbidopa/levodopa 50/200 at bedtime.  Exercising on stationary bike a few days a week.  No falls.  No hallucination.  In ED on 07/08/16 with c/o HA and lightheadedness.  This has been a chronic c/o for him.  Work up was negative. Last visit, I increased his gabapentin to 300 mg in AM and 600 mg in PM to see if that would help with headache.  It doesn't appear that he did this but when we called him to follow up he stated that his headache was better and lightheadedness got better as well. States today that only on gabapentin at night.  However, the lightheadedness came back.  Sometimes he uses the words headaches and dizziness interchangeably but overall he does state that headaches are better and "off balance" is the biggest issue.  The dizziness is all day.    I had him try and hold the levodopa for a few days but he was only able to hold it for 2 days and then stated that the pain over his whole body was so bad that he restarted the medication.  He didn't think perhaps his complaints of lightheadedness/headache were better for the 2 days that he was off of the medication, but has some difficulty telling if this is the case.  10/30/16 update:  The patient follows up today.  I switched him from immediate release levodopa last visit to carbidopa/levodopa 25/100 CR and had him take 3 tablets at 9 AM/3 tablets at noon and 3 tablets at 3 PM.  I asked him to continue the carbidopa/levodopa 50/200 at bedtime.  We did this primarily because of complaints of dizziness.  He went to the emergency room in January because of  chest pain.  While there, he complained about difficulty walking and dizziness.  He did call me and try to get in in January, but no appointments were available (he canceled or missed 3 appointments with me in December).  Pt denies dizziness today and reports that dizziness hasn't been a big issue, despite what had been reported.  States that his problem occurred when a home health aid told him to back down on his carbidopa/levodopa 25/100 CR from 3 po tid to 2 po  tid and he started not feeling well after that.  Reports that he was doing well before that.  States that she was telling him to go up and down on the dosages and he didn't feel well.  He actually stopped it for a short period of time.  Once he got back up on carbidopa/levodopa 25/100 CR 3 po tid he felt much better and denies any real sense of dizziness.  He is exercising faithfully and thinks that has been really helpful.  Works out with trainer 2 days a week and does other exercises himself every other day of the week.  No falls.    03/19/17 update:  Patient is on carbidopa/levodopa 25/100 CR, 3 tablets at 9 AM/3 tablets at noon and 3 tablets at 3 PM.  He is also on carbidopa/levodopa 50/200 at bedtime.  He called me earlier in the month to state that he was having nausea that he associated with levodopa.  I thought that this was strange given that he has been on levodopa for many years but he insisted that it was from the medicine.  He was given a short-term prescription for Zofran.  He states that this helped.  No falls since our last visit.  "I feel pretty good."  Some back pain if sits a while.  Walking for exercise.  PT just ended this week and "it helped a lot."  On gabapentin for sleep and that "makes me drowsy."    Neuroimaging has previously been performed.  It is not available for my review today.  PREVIOUS MEDICATIONS: Sinemet  ALLERGIES:  No Known Allergies  CURRENT MEDICATIONS:  Outpatient Encounter Prescriptions as of 03/19/2017    Medication Sig  . amLODipine (NORVASC) 5 MG tablet Take 5 mg by mouth daily.  Marland Kitchen aspirin EC 81 MG tablet Take 81 mg by mouth daily.  Marland Kitchen atorvastatin (LIPITOR) 20 MG tablet Take 20 mg by mouth at bedtime.   . carbidopa-levodopa (SINEMET CR) 50-200 MG tablet TAKE ONE TABLET AT BEDTIME.  . Carbidopa-Levodopa ER (SINEMET CR) 25-100 MG tablet controlled release Take 3 tablets by mouth 3 (three) times daily.  . colchicine 0.6 MG tablet Take 0.6 mg by mouth every 8 (eight) hours as needed (for gout flares).  . Elastic Bandages & Supports (ABDOMINAL BINDER/ELASTIC LARGE) MISC 1 Device by Does not apply route daily. DX: i95.1  . gabapentin (NEURONTIN) 300 MG capsule Take 600 mg by mouth at bedtime.  . ondansetron (ZOFRAN) 4 MG tablet Take 1 tablet (4 mg total) by mouth every morning.  . quinapril (ACCUPRIL) 20 MG tablet Take 20 mg by mouth at bedtime.   . sitaGLIPtin (JANUVIA) 50 MG tablet Take 50 mg by mouth daily.  Marland Kitchen zolpidem (AMBIEN) 10 MG tablet Take 10 mg by mouth at bedtime as needed for sleep.  . [DISCONTINUED] Oxymetazoline HCl (VICKS SINEX NA) Place 1 spray into both nostrils daily as needed (congestion).   No facility-administered encounter medications on file as of 03/19/2017.     PAST MEDICAL HISTORY:   Past Medical History:  Diagnosis Date  . CAD (coronary artery disease)    Stent in [place]; First diagonal  . Diabetes (HCC)   . Hyperlipidemia   . Hypertension   . Parkinson's disease (HCC)     PAST SURGICAL HISTORY:   Past Surgical History:  Procedure Laterality Date  . CORONARY ANGIOPLASTY WITH STENT PLACEMENT      SOCIAL HISTORY:   Social History   Social History  . Marital  status: Single    Spouse name: N/A  . Number of children: N/A  . Years of education: N/A   Occupational History  . Not on file.   Social History Main Topics  . Smoking status: Never Smoker  . Smokeless tobacco: Never Used  . Alcohol use No  . Drug use: No  . Sexual activity: Not on file    Other Topics Concern  . Not on file   Social History Narrative  . No narrative on file    FAMILY HISTORY:   Family Status  Relation Status  . Mother Deceased       alzheimer's  . Father Deceased       HTN  . Sister Deceased       x3    ROS:  A complete 10 system review of systems was obtained and was unremarkable apart from what is mentioned above.  PHYSICAL EXAMINATION:    VITALS:   Vitals:   03/19/17 1017  SpO2: 98%  Weight: 204 lb (92.5 kg)  Height: 6\' 3"  (1.905 m)   Orthostatic VS for the past 24 hrs (Last 3 readings):  BP- Lying Pulse- Lying BP- Sitting Pulse- Sitting BP- Standing at 0 minutes Pulse- Standing at 0 minutes  03/19/17 1018 140/70 82 132/70 80 126/66 86     GEN:  The patient appears stated age and is in NAD. HEENT:  Normocephalic, atraumatic.  The mucous membranes are moist. The superficial temporal arteries are without ropiness or tenderness. CV:  RRR Lungs:  CTAB Neck/HEME:  There are no carotid bruits bilaterally.  Neurological examination:  Orientation:  Montreal Cognitive Assessment  10/30/2016 06/28/2015  Visuospatial/ Executive (0/5) 0 1  Naming (0/3) 2 2  Attention: Read list of digits (0/2) 2 1  Attention: Read list of letters (0/1) 1 1  Attention: Serial 7 subtraction starting at 100 (0/3) 3 3  Language: Repeat phrase (0/2) 2 2  Language : Fluency (0/1) 1 1  Abstraction (0/2) 2 2  Delayed Recall (0/5) 2 0  Orientation (0/6) 6 5  Total 21 18  Adjusted Score (based on education) 22 19   Cranial nerves: There is good facial symmetry. There is facial hypomimia.  Pupils are equal round and reactive to light bilaterally. Fundoscopic exam reveals clear margins bilaterally. Extraocular muscles are intact. There is poor smooth pursuit.  The visual fields are full to confrontational testing. The speech is fluent and clear.  He is very hypophonic.   Soft palate rises symmetrically and there is no tongue deviation. Hearing is intact to  conversational tone. Sensation: Sensation is intact to light touch throughout. Motor: Strength is 5/5 in the bilateral upper and lower extremities.   Shoulder shrug is equal and symmetric.  There is no pronator drift.   Movement examination: Tone: There is mild increased tone bilaterally today in the UE (does have difficulty relaxing to examine) Abnormal movements: mild dyskinesia today on the L leg Coordination:  There is decremation with RAM's, with all form of RAMS, including alternating supination and pronation of the forearm, hand opening and closing, finger taps, heel taps and toe taps, right more than left Gait and Station: The patient has difficulty arising out of a deep-seated chair without the use of the hands and is unable to do that today; in fact, he cannot do it without pushing off.   Has start hesitation but once he gets in the open he does well wiith the cane.  He turns en bloc.  LABS:  Patient had lab work on 12/04/2016 3 sodium was 142, potassium 4.4, chloride 106, CO2 28, BUN 23, creatinine 1.01 and glucose 120.  AST was 14, ALT 10 and alkaline phosphatase 87.  ASSESSMENT/PLAN:  1.  Parkinsonism.  I suspect that this does represent idiopathic Parkinson's disease.  The patient has tremor, bradykinesia, rigidity and postural instability.  -continue carbidopa/levodopa 25/100 CR, 3 tablets at 9 AM/noon/3 PM.    He will continue carbidopa/levodopa 50/200 at bedtime.   -using zofran prn nausea 2.  Parkinson's disease dementia  -He is living at assisted living.  I would like to see them manage meds but he is doing that for now.  His MoCA actually improved. 3.  Constipation  -This is frequently associated with Parkinson's disease.  Has rancho recipe 4.  Orthostatic hypotension  -he was minimally orthostatic in the office today and has no sx's.  BP fairly good today and better than previous.  On BP meds (accupril/norvasc) and will need to continue to monitor 5.  Diabetic  peripheral neuropathy  -Safety was discussed.  -The patient is on gabapentin, 600 mg at night.  He tells me that this is primarily for sleep.   6.  Headache  -STA without ropiness or tenderness.  No concerning features.   -headaches now resolved with the gabapentin 7.  Follow-up in the next 34-5 months, sooner should new neurologic issues arise.  Much greater than 50% of this visit was spent in counseling and coordinating care.  Total face to face time:  30 min

## 2017-03-19 ENCOUNTER — Encounter: Payer: Self-pay | Admitting: Neurology

## 2017-03-19 ENCOUNTER — Ambulatory Visit (INDEPENDENT_AMBULATORY_CARE_PROVIDER_SITE_OTHER): Payer: Federal, State, Local not specified - PPO | Admitting: Neurology

## 2017-03-19 VITALS — Ht 75.0 in | Wt 204.0 lb

## 2017-03-19 DIAGNOSIS — F0280 Dementia in other diseases classified elsewhere without behavioral disturbance: Secondary | ICD-10-CM

## 2017-03-19 DIAGNOSIS — G20 Parkinson's disease: Secondary | ICD-10-CM

## 2017-03-19 DIAGNOSIS — I951 Orthostatic hypotension: Secondary | ICD-10-CM

## 2017-03-19 MED ORDER — CARBIDOPA-LEVODOPA ER 50-200 MG PO TBCR: 1.0000 | EXTENDED_RELEASE_TABLET | Freq: Every day | ORAL | 0 refills | Status: DC

## 2017-03-19 MED ORDER — CARBIDOPA-LEVODOPA ER 25-100 MG PO TBCR: 3.0000 | EXTENDED_RELEASE_TABLET | Freq: Three times a day (TID) | ORAL | 2 refills | Status: DC

## 2017-03-19 NOTE — Patient Instructions (Signed)
You look great today!  Keep up the good work exercising.  I will see you in 4-5 months!

## 2017-04-29 DIAGNOSIS — R079 Chest pain, unspecified: Secondary | ICD-10-CM

## 2017-04-29 HISTORY — DX: Chest pain, unspecified: R07.9

## 2017-05-10 ENCOUNTER — Emergency Department (HOSPITAL_COMMUNITY): Payer: Federal, State, Local not specified - PPO

## 2017-05-10 ENCOUNTER — Observation Stay (HOSPITAL_COMMUNITY)
Admission: EM | Admit: 2017-05-10 | Discharge: 2017-05-12 | Disposition: A | Discharge status: Home / Self Care | Payer: Federal, State, Local not specified - PPO | Attending: Internal Medicine | Admitting: Internal Medicine

## 2017-05-10 ENCOUNTER — Encounter (HOSPITAL_COMMUNITY): Payer: Self-pay | Admitting: Emergency Medicine

## 2017-05-10 DIAGNOSIS — Z955 Presence of coronary angioplasty implant and graft: Secondary | ICD-10-CM | POA: Insufficient documentation

## 2017-05-10 DIAGNOSIS — I10 Essential (primary) hypertension: Secondary | ICD-10-CM | POA: Diagnosis present

## 2017-05-10 DIAGNOSIS — I2510 Atherosclerotic heart disease of native coronary artery without angina pectoris: Secondary | ICD-10-CM

## 2017-05-10 DIAGNOSIS — Z7982 Long term (current) use of aspirin: Secondary | ICD-10-CM | POA: Diagnosis not present

## 2017-05-10 DIAGNOSIS — Z7984 Long term (current) use of oral hypoglycemic drugs: Secondary | ICD-10-CM | POA: Insufficient documentation

## 2017-05-10 DIAGNOSIS — R079 Chest pain, unspecified: Principal | ICD-10-CM | POA: Diagnosis present

## 2017-05-10 DIAGNOSIS — Z79899 Other long term (current) drug therapy: Secondary | ICD-10-CM | POA: Diagnosis not present

## 2017-05-10 DIAGNOSIS — G20 Parkinson's disease: Secondary | ICD-10-CM | POA: Insufficient documentation

## 2017-05-10 DIAGNOSIS — E785 Hyperlipidemia, unspecified: Secondary | ICD-10-CM | POA: Diagnosis present

## 2017-05-10 DIAGNOSIS — G20A1 Parkinson's disease without dyskinesia, without mention of fluctuations: Secondary | ICD-10-CM | POA: Diagnosis present

## 2017-05-10 DIAGNOSIS — E119 Type 2 diabetes mellitus without complications: Secondary | ICD-10-CM | POA: Insufficient documentation

## 2017-05-10 HISTORY — DX: Chest pain, unspecified: R07.9

## 2017-05-10 LAB — GLUCOSE, CAPILLARY
GLUCOSE-CAPILLARY: 93 mg/dL (ref 65–99)
GLUCOSE-CAPILLARY: 169 mg/dL — AB (ref 65–99)

## 2017-05-10 LAB — CBC WITH DIFFERENTIAL/PLATELET
BASOS PCT: 0 %
Basophils Absolute: 0 10*3/uL (ref 0.0–0.1)
EOS ABS: 0 10*3/uL (ref 0.0–0.7)
EOS PCT: 1 %
HCT: 27.1 % — ABNORMAL LOW (ref 39.0–52.0)
Hemoglobin: 9 g/dL — ABNORMAL LOW (ref 13.0–17.0)
Lymphocytes Relative: 28 %
Lymphs Abs: 0.9 10*3/uL (ref 0.7–4.0)
MCH: 36 pg — AB (ref 26.0–34.0)
MCHC: 33.2 g/dL (ref 30.0–36.0)
MCV: 108.4 fL — ABNORMAL HIGH (ref 78.0–100.0)
Monocytes Absolute: 0.2 10*3/uL (ref 0.1–1.0)
Monocytes Relative: 5 %
NEUTROS PCT: 66 %
Neutro Abs: 2.1 10*3/uL (ref 1.7–7.7)
Platelets: 188 10*3/uL (ref 150–400)
RBC: 2.5 MIL/uL — ABNORMAL LOW (ref 4.22–5.81)
RDW: 14.6 % (ref 11.5–15.5)
WBC: 3.2 10*3/uL — AB (ref 4.0–10.5)

## 2017-05-10 LAB — TROPONIN I: Troponin I: <0.03 ng/mL (ref <0.03)

## 2017-05-10 LAB — COMPREHENSIVE METABOLIC PANEL
ALBUMIN: 3.9 g/dL (ref 3.5–5.0)
ALK PHOS: 63 U/L (ref 38–126)
ALT: 15 U/L — ABNORMAL LOW (ref 17–63)
ANION GAP: 7 (ref 5–15)
AST: 16 U/L (ref 15–41)
BILIRUBIN TOTAL: 1.3 mg/dL — AB (ref 0.3–1.2)
BUN: 19 mg/dL (ref 6–20)
CALCIUM: 8.8 mg/dL — AB (ref 8.9–10.3)
CO2: 25 mmol/L (ref 22–32)
Chloride: 108 mmol/L (ref 101–111)
Creatinine, Ser: 1.27 mg/dL — ABNORMAL HIGH (ref 0.61–1.24)
GFR calc non Af Amer: 55 mL/min — ABNORMAL LOW (ref >60)
Glucose, Bld: 118 mg/dL — ABNORMAL HIGH (ref 65–99)
POTASSIUM: 4.1 mmol/L (ref 3.5–5.1)
Sodium: 140 mmol/L (ref 135–145)
TOTAL PROTEIN: 6.4 g/dL — AB (ref 6.5–8.1)

## 2017-05-10 LAB — URINALYSIS, ROUTINE W REFLEX MICROSCOPIC
BILIRUBIN URINE: NEGATIVE
GLUCOSE, UA: NEGATIVE mg/dL
HGB URINE DIPSTICK: NEGATIVE
KETONES UR: NEGATIVE mg/dL
Leukocytes, UA: NEGATIVE
Nitrite: NEGATIVE
PROTEIN: NEGATIVE mg/dL
Specific Gravity, Urine: 1.029 (ref 1.005–1.030)
pH: 6 (ref 5.0–8.0)

## 2017-05-10 LAB — LIPID PANEL
CHOLESTEROL: 115 mg/dL (ref 0–200)
HDL: 55 mg/dL (ref >40)
LDL Cholesterol: 55 mg/dL (ref 0–99)
Total CHOL/HDL Ratio: 2.1 RATIO
Triglycerides: 26 mg/dL (ref <150)
VLDL: 5 mg/dL (ref 0–40)

## 2017-05-10 LAB — D-DIMER, QUANTITATIVE (NOT AT ARMC): D DIMER QUANT: 1.76 ug{FEU}/mL — AB (ref 0.00–0.50)

## 2017-05-10 LAB — IRON AND TIBC
IRON: 86 ug/dL (ref 45–182)
Saturation Ratios: 32 % (ref 17.9–39.5)
TIBC: 272 ug/dL (ref 250–450)
UIBC: 186 ug/dL

## 2017-05-10 LAB — VITAMIN B12: VITAMIN B 12: 62 pg/mL — AB (ref 180–914)

## 2017-05-10 LAB — BRAIN NATRIURETIC PEPTIDE: B Natriuretic Peptide: 19.5 pg/mL (ref 0.0–100.0)

## 2017-05-10 LAB — FOLATE: Folate: 31.3 ng/mL (ref >5.9)

## 2017-05-10 LAB — I-STAT CG4 LACTIC ACID, ED: LACTIC ACID, VENOUS: 1.52 mmol/L (ref 0.5–1.9)

## 2017-05-10 LAB — FERRITIN: FERRITIN: 286 ng/mL (ref 24–336)

## 2017-05-10 MED ORDER — ASPIRIN 81 MG PO CHEW: 324.0000 mg | CHEWABLE_TABLET | Freq: Once | ORAL | Status: DC

## 2017-05-10 MED ORDER — CARBIDOPA-LEVODOPA ER 50-200 MG PO TBCR
1.0000 | EXTENDED_RELEASE_TABLET | Freq: Every day | ORAL | Status: DC
  Administered 2017-05-10 – 2017-05-11 (×2): 1 via ORAL
  Filled 2017-05-10 (×3): qty 1

## 2017-05-10 MED ORDER — ACETAMINOPHEN 325 MG PO TABS: 650.0000 mg | ORAL_TABLET | ORAL | Status: DC | PRN

## 2017-05-10 MED ORDER — ENOXAPARIN SODIUM 40 MG/0.4ML ~~LOC~~ SOLN
40.0000 mg | SUBCUTANEOUS | Status: DC
  Administered 2017-05-11: 40 mg via SUBCUTANEOUS
  Filled 2017-05-10: qty 0.4

## 2017-05-10 MED ORDER — INSULIN ASPART 100 UNIT/ML ~~LOC~~ SOLN
0.0000 [IU] | Freq: Three times a day (TID) | SUBCUTANEOUS | Status: DC
  Administered 2017-05-11 – 2017-05-12 (×2): 2 [IU] via SUBCUTANEOUS

## 2017-05-10 MED ORDER — GABAPENTIN 300 MG PO CAPS
600.0000 mg | ORAL_CAPSULE | Freq: Every day | ORAL | Status: DC
  Administered 2017-05-10 – 2017-05-11 (×2): 600 mg via ORAL
  Filled 2017-05-10 (×2): qty 2

## 2017-05-10 MED ORDER — QUINAPRIL HCL 10 MG PO TABS
20.0000 mg | ORAL_TABLET | Freq: Every day | ORAL | Status: DC
  Administered 2017-05-10 – 2017-05-11 (×2): 20 mg via ORAL
  Filled 2017-05-10 (×3): qty 2

## 2017-05-10 MED ORDER — ASPIRIN EC 81 MG PO TBEC
81.0000 mg | DELAYED_RELEASE_TABLET | Freq: Every day | ORAL | Status: DC
  Administered 2017-05-11 – 2017-05-12 (×2): 81 mg via ORAL
  Filled 2017-05-10 (×2): qty 1

## 2017-05-10 MED ORDER — ATORVASTATIN CALCIUM 20 MG PO TABS
20.0000 mg | ORAL_TABLET | Freq: Every day | ORAL | Status: DC
  Administered 2017-05-10 – 2017-05-11 (×2): 20 mg via ORAL
  Filled 2017-05-10 (×2): qty 1

## 2017-05-10 MED ORDER — AMLODIPINE BESYLATE 5 MG PO TABS
5.0000 mg | ORAL_TABLET | Freq: Every day | ORAL | Status: DC
  Administered 2017-05-11 – 2017-05-12 (×2): 5 mg via ORAL
  Filled 2017-05-10 (×2): qty 1

## 2017-05-10 MED ORDER — IOPAMIDOL (ISOVUE-370) INJECTION 76%
INTRAVENOUS | Status: AC
  Administered 2017-05-10: 100 mL
  Filled 2017-05-10: qty 100

## 2017-05-10 MED ORDER — ONDANSETRON HCL 4 MG/2ML IJ SOLN: 4.0000 mg | Freq: Four times a day (QID) | INTRAMUSCULAR | Status: DC | PRN

## 2017-05-10 NOTE — ED Triage Notes (Signed)
Pt arrives via [facility] for c/o left sided lower rib pain that radiates into left abdomen x2 days, pt denies any injury or strenuous activity. No sob or cough, received 324mg  asa pta. Pt a/o x4, resp e/u, VSS.

## 2017-05-10 NOTE — ED Provider Notes (Signed)
[facility]-EMERGENCY DEPT Provider Note   CSN: [id number] Arrival date & time: 05/10/17  1120     History   Chief Complaint Chief Complaint  Patient presents with  . Chest Pain    HPI Gabriel Ramirez is a 71 y.o. male.  HPI  71 year old male with history of coronary artery disease, hypertension, hyperlipidemia, Parkinson's here with left chest pain. Patient states that for the last 3 days, he has had sharp, intermittent left chest pain. The pain occasionally radiates to his left upper quadrant. Pain is worse with movement and palpation. He denies any associated shortness of breath. He denies any pressure-like sensation. He has a history of coronary artery disease but states that his current pain is very different from this. Denies any fevers or chills. Denies any cough. No lower extremity edema. Pain is improved when lying still.  Past Medical History:  Diagnosis Date  . CAD (coronary artery disease)    Stent in [place]; [facility]  . Diabetes (HCC)   . Hyperlipidemia   . Hypertension   . Parkinson's disease [facility])     Patient Active Problem List   Diagnosis Date Noted  . Chest pain 05/10/2017  . CAD (coronary artery disease) 08/31/2015  . Essential hypertension 08/31/2015  . Hyperlipidemia 08/31/2015  . Parkinson's disease (HCC) 08/31/2015    Past Surgical History:  Procedure Laterality Date  . CORONARY ANGIOPLASTY WITH STENT PLACEMENT         Home Medications    Prior to Admission medications   Medication Sig Start Date End Date Taking? Authorizing Provider  amLODipine (NORVASC) 5 MG tablet Take 5 mg by mouth daily. 08/29/16  Yes [provider]  aspirin EC 81 MG tablet Take 81 mg by mouth daily.   Yes [provider]  atorvastatin (LIPITOR) 20 MG tablet Take 20 mg by mouth at bedtime.    Yes [provider]  carbidopa-levodopa (SINEMET CR) 50-200 MG tablet Take 1 tablet by mouth at bedtime. 03/19/17  Yes Tat, Octaviano Batty, DO    Carbidopa-Levodopa ER (SINEMET CR) 25-100 MG tablet controlled release Take 3 tablets by mouth 3 (three) times daily. 03/19/17  Yes Tat, Octaviano Batty, DO  colchicine 0.6 MG tablet Take 0.6 mg by mouth every 8 (eight) hours as needed (for gout flares).   Yes [provider]  gabapentin (NEURONTIN) 300 MG capsule Take 600 mg by mouth at bedtime.   Yes [provider]  quinapril (ACCUPRIL) 20 MG tablet Take 20 mg by mouth at bedtime.    Yes [provider]  sitaGLIPtin (JANUVIA) 50 MG tablet Take 50 mg by mouth daily.   Yes [provider]  zolpidem (AMBIEN) 10 MG tablet Take 10 mg by mouth at bedtime as needed for sleep.   Yes [provider]  Elastic Bandages & Supports (ABDOMINAL BINDER/ELASTIC LARGE) MISC 1 Device by Does not apply route daily. DX: i95.1 10/30/16   Tat, Octaviano Batty, DO  ondansetron (ZOFRAN) 4 MG tablet Take 1 tablet (4 mg total) by mouth every morning. 03/11/17   TatOctaviano Batty, DO    Family History Family History  Problem Relation Age of Onset  . Alzheimer's disease Mother   . Hypertension Father     Social History Social History  Substance Use Topics  . Smoking status: Never Smoker  . Smokeless tobacco: Never Used  . Alcohol use No     Allergies   Patient has no known allergies.   Review of Systems Review of  Systems  Constitutional: Positive for fatigue. Negative for chills and fever.  HENT: Negative for congestion and rhinorrhea.   Eyes: Negative for visual disturbance.  Respiratory: Positive for chest tightness. Negative for cough, shortness of breath and wheezing.   Cardiovascular: Positive for chest pain. Negative for leg swelling.  Gastrointestinal: Negative for abdominal pain, diarrhea, nausea and vomiting.  Genitourinary: Negative for dysuria and flank pain.  Musculoskeletal: Negative for neck pain and neck stiffness.  Skin: Negative for rash and wound.  Allergic/Immunologic: Negative for immunocompromised  state.  Neurological: Negative for syncope, weakness and headaches.  All other systems reviewed and are negative.    Physical Exam Updated Vital Signs BP (!) 152/76 (BP Location: Left Arm)   Pulse 68   Temp 98.6 F (37 C) (Oral)   Resp 14   SpO2 100%   Physical Exam  Constitutional: He is oriented to person, place, and time. He appears well-developed and well-nourished. No distress.  HENT:  Head: Normocephalic and atraumatic.  Eyes: Conjunctivae are normal.  Neck: Neck supple.  Cardiovascular: Normal rate, regular rhythm and normal heart sounds.  Exam reveals no friction rub.   No murmur heard. Pulmonary/Chest: Effort normal and breath sounds normal. No respiratory distress. He has no wheezes. He has no rales. He exhibits tenderness (Mild tenderness to palpation over left lateral chest wall, no bruising or deformity).  Abdominal: He exhibits no distension.  Musculoskeletal: He exhibits no edema.  Neurological: He is alert and oriented to person, place, and time. He exhibits normal muscle tone.  Skin: Skin is warm. Capillary refill takes less than 2 seconds.  Psychiatric: He has a normal mood and affect.  Flat affect  Nursing note and vitals reviewed.    ED Treatments / Results  Labs (all labs ordered are listed, but only abnormal results are displayed) Labs Reviewed  CBC WITH DIFFERENTIAL/PLATELET - Abnormal; Notable for the following:       Result Value   WBC 3.2 (*)    RBC 2.50 (*)    Hemoglobin 9.0 (*)    HCT 27.1 (*)    MCV 108.4 (*)    MCH 36.0 (*)    All other components within normal limits  COMPREHENSIVE METABOLIC PANEL - Abnormal; Notable for the following:    Glucose, Bld 118 (*)    Creatinine, Ser 1.27 (*)    Calcium 8.8 (*)    Total Protein 6.4 (*)    ALT 15 (*)    Total Bilirubin 1.3 (*)    GFR calc non Af Amer 55 (*)    All other components within normal limits  D-DIMER, QUANTITATIVE (NOT AT [facility]) - Abnormal; Notable for the following:     D-Dimer, Quant 1.76 (*)    All other components within normal limits  BRAIN NATRIURETIC PEPTIDE  TROPONIN I  URINALYSIS, ROUTINE W REFLEX MICROSCOPIC  GLUCOSE, CAPILLARY  TROPONIN I  VITAMIN B12  FOLATE  CBC  HEMOGLOBIN A1C  IRON AND TIBC  FERRITIN  BASIC METABOLIC PANEL  LIPID PANEL  TROPONIN I  TROPONIN I  I-STAT CG4 LACTIC ACID, ED  POC OCCULT BLOOD, ED    EKG  EKG Interpretation  Date/Time:  Sunday May 10 2017 11:23:00 EDT Ventricular Rate:  70 PR Interval:    QRS Duration: 84 QT Interval:  382 QTC Calculation: 413 R Axis:   -27 Text Interpretation:  Inferior infarct, old Consider anterior infarct No significant change since last tracing Confirmed by Shaune Pollack [phone]) on 05/10/2017 3:30:09 PM  Radiology Dg Chest 2 View  Result Date: 05/10/2017 CLINICAL DATA:  Larey Seat at home today, LEFT lower rib pain radiating to LEFT abdomen, history diabetes mellitus, hypertension EXAM: CHEST  2 VIEW COMPARISON:  None FINDINGS: Upper normal heart size. Mediastinal contours and pulmonary vascularity normal. Lungs clear. No pleural effusion or pneumothorax. Bones mildly demineralized but otherwise unremarkable. IMPRESSION: No acute abnormalities. Electronically Signed   By: Ulyses Southward M.D.   On: 05/10/2017 13:13   Ct Angio Chest Pe W Or Wo Contrast  Result Date: 05/10/2017 CLINICAL DATA:  71 year old male with history of left-sided chest, rib and upper abdominal pain for the past 2 days. EXAM: CT ANGIOGRAPHY CHEST WITH CONTRAST TECHNIQUE: Multidetector CT imaging of the chest was performed using the standard protocol during bolus administration of intravenous contrast. Multiplanar CT image reconstructions and MIPs were obtained to evaluate the vascular anatomy. CONTRAST:  100 mL of Isovue 370. COMPARISON:  None. FINDINGS: Cardiovascular: There are no filling defects within the pulmonary arterial tree to suggest underlying pulmonary embolism. Heart size is normal. There is no  significant pericardial fluid, thickening or pericardial calcification. There is aortic atherosclerosis, as well as atherosclerosis of the great vessels of the mediastinum and the coronary arteries, including calcified atherosclerotic plaque in the left main, left anterior descending, left circumflex and right coronary arteries. Mediastinum/Nodes: No pathologically enlarged mediastinal or hilar lymph nodes. Esophagus is unremarkable in appearance. No axillary lymphadenopathy. Lungs/Pleura: No acute consolidative airspace disease. No pleural effusions. No suspicious appearing pulmonary nodules or masses. Upper Abdomen: Numerous partially calcified gallstones lying dependently in the gallbladder. Musculoskeletal: There are no aggressive appearing lytic or blastic lesions noted in the visualized portions of the skeleton. Review of the MIP images confirms the above findings. IMPRESSION: 1. No evidence of pulmonary embolism. 2. No acute findings in the thorax to account for the patient's symptoms. 3. Aortic atherosclerosis, in addition to left main and 3 vessel coronary artery disease. Assessment for potential risk factor modification, dietary therapy or pharmacologic therapy may be warranted, if clinically indicated. Aortic Atherosclerosis (ICD10-I70.0). Electronically Signed   By: Trudie Reed M.D.   On: 05/10/2017 15:01    Procedures Procedures (including critical care time)  Medications Ordered in ED Medications  aspirin chewable tablet 324 mg (0 mg Oral Hold 05/10/17 1557)  carbidopa-levodopa (SINEMET CR) 50-200 MG per tablet controlled release 1 tablet (not administered)  amLODipine (NORVASC) tablet 5 mg (not administered)  aspirin EC tablet 81 mg (not administered)  gabapentin (NEURONTIN) capsule 600 mg (not administered)  atorvastatin (LIPITOR) tablet 20 mg (not administered)  quinapril (ACCUPRIL) tablet 20 mg (not administered)  acetaminophen (TYLENOL) tablet 650 mg (not administered)   ondansetron (ZOFRAN) injection 4 mg (not administered)  enoxaparin (LOVENOX) injection 40 mg (not administered)  insulin aspart (novoLOG) injection 0-15 Units (not administered)  iopamidol (ISOVUE-370) 76 % injection (100 mLs  Contrast Given 05/10/17 1400)     Initial Impression / Assessment and Plan / ED Course  I have reviewed the triage vital signs and the nursing notes.  Pertinent labs & imaging results that were available during my care of the patient were reviewed by me and considered in my medical decision making (see chart for details).     71 year old male with history of known coronary disease here with atypical chest pain, though history limited due to dementia. Vital signs stable on arrival. Labwork remarkable for mild worsening of anemia. Creatinine also elevated. Lactic acid normal. Troponin negative. EKG is nonischemic. Given sharp, pleuritic nature  of pain, D_Dimer obtained and is positive - CT Angio obtained and shows no PE but is c/f diffuse CAD.   Although CP story is somewhat atypical, pt is very high risk with multiple vessel CAD on CT s/p PCI in past - will admit for high risk CP eval.  Final Clinical Impressions(s) / ED Diagnoses   Final diagnoses:  Chest pain, unspecified type  Atherosclerosis of native coronary artery of native heart without angina pectoris    New Prescriptions Current Discharge Medication List       Shaune PollackIsaacs, Suman Trivedi, MD 05/10/17 1919

## 2017-05-10 NOTE — ED Notes (Signed)
Assisted pt with using urinal °

## 2017-05-10 NOTE — H&P (Signed)
History and Physical    Gabriel HaddockVanderbilt Juhasz ZOX:[id number] DOB: 04/09/1946 DOA: 05/10/2017  PCP: Clovis RileyMitchell, L.August Saucerean, MD  3 Patient coming from: assisted living    Chief Complaint: left lower chest pain  HPI: Gabriel Ramirez is a 71 y.o. male with medical history significant of parkinson's disease, CAD s/p stent ~6 years ago, HTN, DM, here for left chest pain.   Patient is a poor historian. Cites a few days of lower left chest pain. Sharp and stabbing, comes and goes. None currently. Provoked by certain movements, not necessarily walking. No associated sob. No cough. Otherwise feeling well. Afebrile. No leg swelling. No recent immobility. No recent med changes. No recent injuries or falls. Unsure what if anything relieves the pain. Denies melena/hematochezia  ED Course: cta, labs  Review of Systems: As per HPI otherwise 10 point review of systems negative.    Past Medical History:  Diagnosis Date  . CAD (coronary artery disease)    [place] [facility]  . Diabetes (HCC)   . Hyperlipidemia   . Hypertension   . Parkinson's disease [facility](HCC)     Past Surgical History:  Procedure Laterality Date  . CORONARY ANGIOPLASTY WITH STENT PLACEMENT       reports that he has never smoked. He has never used smokeless tobacco. He reports that he does not drink alcohol or use drugs.  No Known Allergies  Family History  Problem Relation Age of Onset  . Alzheimer's disease Mother   . Hypertension Father      Prior to Admission medications   Medication Sig Start Date End Date Taking? Authorizing Provider  amLODipine (NORVASC) 5 MG tablet Take 5 mg by mouth daily. 08/29/16   [provider]  aspirin EC 81 MG tablet Take 81 mg by mouth daily.    [provider]  atorvastatin (LIPITOR) 20 MG tablet Take 20 mg by mouth at bedtime.     [provider]  carbidopa-levodopa (SINEMET CR) 50-200 MG tablet Take 1 tablet by mouth at bedtime. 03/19/17   Tat, Octaviano Battyebecca S,  DO  Carbidopa-Levodopa ER (SINEMET CR) 25-100 MG tablet controlled release Take 3 tablets by mouth 3 (three) times daily. 03/19/17   Tat, Octaviano Battyebecca S, DO  colchicine 0.6 MG tablet Take 0.6 mg by mouth every 8 (eight) hours as needed (for gout flares).    [provider]  Elastic Bandages & Supports (ABDOMINAL BINDER/ELASTIC LARGE) MISC 1 Device by Does not apply route daily. DX: i95.1 10/30/16   Tat, Octaviano Battyebecca S, DO  gabapentin (NEURONTIN) 300 MG capsule Take 600 mg by mouth at bedtime.    [provider]  ondansetron (ZOFRAN) 4 MG tablet Take 1 tablet (4 mg total) by mouth every morning. 03/11/17   Tat, Octaviano Battyebecca S, DO  quinapril (ACCUPRIL) 20 MG tablet Take 20 mg by mouth at bedtime.     [provider]  sitaGLIPtin (JANUVIA) 50 MG tablet Take 50 mg by mouth daily.    [provider]  zolpidem (AMBIEN) 10 MG tablet Take 10 mg by mouth at bedtime as needed for sleep.    [provider]    Physical Exam: Vitals:   05/10/17 1330 05/10/17 1345 05/10/17 1355 05/10/17 1445  BP: 127/66 129/65    Pulse: 63 62 63 78  Resp: 10 12 12 18   Temp:      SpO2: 100% 100% 100% 100%    Constitutional: NAD, calm, comfortable Vitals:   05/10/17 1330 05/10/17 1345 05/10/17 1355 05/10/17 1445  BP: 127/66 129/65    Pulse: 63 62 63 78  Resp: 10 12 12 18   Temp:      SpO2: 100% 100% 100% 100%   Eyes: PERRL ENMT: Mucous membranes are moist. Neck: supple Respiratory: clear to auscultation bilaterally, no wheezing, no crackles. Normal respiratory effort. No accessory muscle use.  Cardiovascular: Regular rate and rhythm, no murmurs / rubs / gallops. No extremity edema. 2+ pedal pulses. No carotid bruits. No chest wall tenderness Abdomen: no tenderness, no masses palpated. No hepatosplenomegaly. Bowel sounds positive.  Musculoskeletal: no clubbing / cyanosis.  Skin: no rashes, lesions, ulcers. No induration Neurologic: moves all 4 limbs. Resting tremor Psychiatric: oriented  to person and place but not to year. Slow to answer questions.   Labs on Admission: I have personally reviewed following labs and imaging studies  CBC:  Recent Labs Lab 05/10/17 1136  WBC 3.2*  NEUTROABS 2.1  HGB 9.0*  HCT 27.1*  MCV 108.4*  PLT 188   Basic Metabolic Panel:  Recent Labs Lab 05/10/17 1136  NA 140  K 4.1  CL 108  CO2 25  GLUCOSE 118*  BUN 19  CREATININE 1.27*  CALCIUM 8.8*   GFR: CrCl cannot be calculated (Unknown ideal weight.). Liver Function Tests:  Recent Labs Lab 05/10/17 1136  AST 16  ALT 15*  ALKPHOS 63  BILITOT 1.3*  PROT 6.4*  ALBUMIN 3.9   No results for input(s): LIPASE, AMYLASE in the last 168 hours. No results for input(s): AMMONIA in the last 168 hours. Coagulation Profile: No results for input(s): INR, PROTIME in the last 168 hours. Cardiac Enzymes:  Recent Labs Lab 05/10/17 1237  TROPONINI <0.03   BNP (last 3 results) No results for input(s): PROBNP in the last 8760 hours. HbA1C: No results for input(s): HGBA1C in the last 72 hours. CBG: No results for input(s): GLUCAP in the last 168 hours. Lipid Profile: No results for input(s): CHOL, HDL, LDLCALC, TRIG, CHOLHDL, LDLDIRECT in the last 72 hours. Thyroid Function Tests: No results for input(s): TSH, T4TOTAL, FREET4, T3FREE, THYROIDAB in the last 72 hours. Anemia Panel: No results for input(s): VITAMINB12, FOLATE, FERRITIN, TIBC, IRON, RETICCTPCT in the last 72 hours. Urine analysis:    Component Value Date/Time   COLORURINE YELLOW 05/10/2017 1438   APPEARANCEUR CLEAR 05/10/2017 1438   LABSPEC 1.029 05/10/2017 1438   PHURINE 6.0 05/10/2017 1438   GLUCOSEU NEGATIVE 05/10/2017 1438   HGBUR NEGATIVE 05/10/2017 1438   BILIRUBINUR NEGATIVE 05/10/2017 1438   KETONESUR NEGATIVE 05/10/2017 1438   PROTEINUR NEGATIVE 05/10/2017 1438   NITRITE NEGATIVE 05/10/2017 1438   LEUKOCYTESUR NEGATIVE 05/10/2017 1438    Radiological Exams on Admission: Dg Chest 2  View  Result Date: 05/10/2017 CLINICAL DATA:  Larey Seat at home today, LEFT lower rib pain radiating to LEFT abdomen, history diabetes mellitus, hypertension EXAM: CHEST  2 VIEW COMPARISON:  None FINDINGS: Upper normal heart size. Mediastinal contours and pulmonary vascularity normal. Lungs clear. No pleural effusion or pneumothorax. Bones mildly demineralized but otherwise unremarkable. IMPRESSION: No acute abnormalities. Electronically Signed   By: Ulyses Southward M.D.   On: 05/10/2017 13:13   Ct Angio Chest Pe W Or Wo Contrast  Result Date: 05/10/2017 CLINICAL DATA:  71 year old male with history of left-sided chest, rib and upper abdominal pain for the past 2 days. EXAM: CT ANGIOGRAPHY CHEST WITH CONTRAST TECHNIQUE: Multidetector CT imaging of the chest was performed using the standard protocol during bolus administration of intravenous contrast. Multiplanar CT image reconstructions and MIPs  were obtained to evaluate the vascular anatomy. CONTRAST:  100 mL of Isovue 370. COMPARISON:  None. FINDINGS: Cardiovascular: There are no filling defects within the pulmonary arterial tree to suggest underlying pulmonary embolism. Heart size is normal. There is no significant pericardial fluid, thickening or pericardial calcification. There is aortic atherosclerosis, as well as atherosclerosis of the great vessels of the mediastinum and the coronary arteries, including calcified atherosclerotic plaque in the left main, left anterior descending, left circumflex and right coronary arteries. Mediastinum/Nodes: No pathologically enlarged mediastinal or hilar lymph nodes. Esophagus is unremarkable in appearance. No axillary lymphadenopathy. Lungs/Pleura: No acute consolidative airspace disease. No pleural effusions. No suspicious appearing pulmonary nodules or masses. Upper Abdomen: Numerous partially calcified gallstones lying dependently in the gallbladder. Musculoskeletal: There are no aggressive appearing lytic or blastic  lesions noted in the visualized portions of the skeleton. Review of the MIP images confirms the above findings. IMPRESSION: 1. No evidence of pulmonary embolism. 2. No acute findings in the thorax to account for the patient's symptoms. 3. Aortic atherosclerosis, in addition to left main and 3 vessel coronary artery disease. Assessment for potential risk factor modification, dietary therapy or pharmacologic therapy may be warranted, if clinically indicated. Aortic Atherosclerosis (ICD10-I70.0). Electronically Signed   By: Trudie Reed M.D.   On: 05/10/2017 15:01    EKG: Independently reviewed. Normal axis, normal intervals, no acute ischemic changes  Assessment/Plan Active Problems:   Chest pain  # Chest pain - atypical. Heart score 5. No chest pain currently. Initial troponin negative and ecg not suggestive of acs. Mild d-dimer elevation - no signs dvt, CTA negative. Labs are significant for new moderate anemia. Given elevated risk admitting to observation. - telemetry - serial troponins - ECG in AM - lipids and a1c for risk stratification - will defer decision to order inpt stress test to inpt team  # Anemia - H 9 from baseline 10.9-12.2. Macrocytic. - b12/folate - hemoccult - iron studies - repeat cbc in AM  # AKI - Cr 1.27 from baseline 1 - push oral hydration, repeat Cr in AM  # DM - glucose wnl on admit - sliding scale insulin  # Parkinson's disease - continue home cinimet  # HTN - at goal - continue home meds  DVT prophylaxis: lovenox/scds Code Status: DNR, discussed in detail w/ patient Family Communication: brother-in-law Adrian Prows [phone] Disposition Plan: back to assisted living Consults called: none Admission status: telemetry   Silvano Bilis MD [facility] Pager [phone]  If 7PM-7AM, please contact night-coverage [email or web address] Password [facility]  05/10/2017, 4:04 PM

## 2017-05-10 NOTE — ED Notes (Signed)
Pt returns from radiology. 

## 2017-05-11 ENCOUNTER — Encounter (HOSPITAL_COMMUNITY): Payer: Self-pay | Admitting: Cardiology

## 2017-05-11 ENCOUNTER — Observation Stay (HOSPITAL_BASED_OUTPATIENT_CLINIC_OR_DEPARTMENT_OTHER): Payer: Federal, State, Local not specified - PPO

## 2017-05-11 DIAGNOSIS — G20 Parkinson's disease: Secondary | ICD-10-CM

## 2017-05-11 DIAGNOSIS — R5381 Other malaise: Secondary | ICD-10-CM

## 2017-05-11 DIAGNOSIS — R072 Precordial pain: Secondary | ICD-10-CM

## 2017-05-11 DIAGNOSIS — R079 Chest pain, unspecified: Secondary | ICD-10-CM | POA: Diagnosis not present

## 2017-05-11 DIAGNOSIS — I10 Essential (primary) hypertension: Secondary | ICD-10-CM | POA: Diagnosis not present

## 2017-05-11 LAB — CBC
HCT: 27.6 % — ABNORMAL LOW (ref 39.0–52.0)
Hemoglobin: 9.2 g/dL — ABNORMAL LOW (ref 13.0–17.0)
MCH: 35.7 pg — AB (ref 26.0–34.0)
MCHC: 33.3 g/dL (ref 30.0–36.0)
MCV: 107 fL — ABNORMAL HIGH (ref 78.0–100.0)
PLATELETS: 167 10*3/uL (ref 150–400)
RBC: 2.58 MIL/uL — ABNORMAL LOW (ref 4.22–5.81)
RDW: 14.1 % (ref 11.5–15.5)
WBC: 3.9 10*3/uL — ABNORMAL LOW (ref 4.0–10.5)

## 2017-05-11 LAB — GLUCOSE, CAPILLARY
GLUCOSE-CAPILLARY: 91 mg/dL (ref 65–99)
Glucose-Capillary: 126 mg/dL — ABNORMAL HIGH (ref 65–99)
Glucose-Capillary: 103 mg/dL — ABNORMAL HIGH (ref 65–99)
Glucose-Capillary: 134 mg/dL — ABNORMAL HIGH (ref 65–99)

## 2017-05-11 LAB — BASIC METABOLIC PANEL
Anion gap: 5 (ref 5–15)
BUN: 16 mg/dL (ref 6–20)
CALCIUM: 8.7 mg/dL — AB (ref 8.9–10.3)
CO2: 26 mmol/L (ref 22–32)
CREATININE: 0.85 mg/dL (ref 0.61–1.24)
Chloride: 107 mmol/L (ref 101–111)
GFR calc Af Amer: >60 mL/min (ref >60)
GFR calc non Af Amer: >60 mL/min (ref >60)
GLUCOSE: 106 mg/dL — AB (ref 65–99)
Potassium: 3.9 mmol/L (ref 3.5–5.1)
Sodium: 138 mmol/L (ref 135–145)

## 2017-05-11 LAB — ECHOCARDIOGRAM COMPLETE
Height: 75 in
Weight: 3038.4 oz

## 2017-05-11 LAB — HEMOGLOBIN A1C
HEMOGLOBIN A1C: 5.7 % — AB (ref 4.8–5.6)
Mean Plasma Glucose: 117 mg/dL

## 2017-05-11 LAB — TROPONIN I

## 2017-05-11 MED ORDER — CYANOCOBALAMIN 1000 MCG/ML IJ SOLN
1000.0000 ug | Freq: Every day | INTRAMUSCULAR | Status: DC
  Administered 2017-05-11 – 2017-05-12 (×2): 1000 ug via INTRAMUSCULAR
  Filled 2017-05-11 (×3): qty 1

## 2017-05-11 NOTE — Consult Note (Signed)
Cardiology Consultation:   Patient ID: Kathrine HaddockVanderbilt Hagerman; [id number]; 02/23/1946   Admit date: 05/10/2017 Date of Consult: 05/11/2017  Primary Care Provider: Clovis RileyMitchell, L.August Saucerean, MD Primary Cardiologist: Dr Jens Somrenshaw   Patient Profile:   Kathrine HaddockVanderbilt Peruski is a 71 y.o. male with a hx of CAD who is being seen today for the evaluation of chest pain at the request of Dr Benjamine MolaVann.  History of Present Illness:   Mr. Sena SlateDumas is a 71 y/o male, originally from [place], with a history of CAD, s/p prior Dx PCI in [place] (no details). He last saw Dr Jens Somrenshaw in Dec 2016 shortly after moving to the area. He has Parkinson's and dementia. He lives in an adult care facility. He describes Lt lateral chest wall pain and LUQ pain. The ED notes say this was described as "sharp" but he tells me it was like "pressure". He says he has had this before but it didn't last this long and he told a staff member at the facility and EMS was called. He is in no discomfort now, sitting up in bed eating breakfast. He was unable to describe his symptoms with his PCI or give me any details about that admission.   On admission he had a D-dimer of 1.76. Chest CTA was negative for PE. It did show 4V CAD. His Troponin has been negative x 2, EKG without acute changes.   Past Medical History:  Diagnosis Date  . CAD (coronary artery disease)    [place] in [place]; [facility]  . Diabetes (HCC)   . Hyperlipidemia   . Hypertension   . Parkinson's disease [facility](HCC)     Past Surgical History:  Procedure Laterality Date  . CORONARY ANGIOPLASTY WITH STENT PLACEMENT       Inpatient Medications: Scheduled Meds: . amLODipine  5 mg Oral Daily  . aspirin  324 mg Oral Once  . aspirin EC  81 mg Oral Daily  . atorvastatin  20 mg Oral QHS  . carbidopa-levodopa  1 tablet Oral QHS  . enoxaparin (LOVENOX) injection  40 mg Subcutaneous Q24H  . gabapentin  600 mg Oral QHS  . insulin aspart  0-15 Units Subcutaneous TID WC  . quinapril  20 mg Oral QHS    Continuous Infusions:  PRN Meds: acetaminophen, ondansetron (ZOFRAN) IV  Allergies:   No Known Allergies  Social History:   Social History   Social History  . Marital status: Single    Spouse name: N/A  . Number of children: N/A  . Years of education: N/A   Occupational History  . Not on file.   Social History Main Topics  . Smoking status: Never Smoker  . Smokeless tobacco: Never Used  . Alcohol use No  . Drug use: No  . Sexual activity: Not on file   Other Topics Concern  . Not on file   Social History Narrative  . No narrative on file    Family History:    Family History  Problem Relation Age of Onset  . Alzheimer's disease Mother   . Hypertension Father      ROS:  Please see the history of present illness.  ROS  All other ROS reviewed and negative.     Physical Exam/Data:   Vitals:   05/10/17 1645 05/10/17 1757 05/10/17 2011 05/11/17 0615  BP: (!) 152/85 (!) 152/76 (!) 141/66 (!) 151/95  Pulse: 75 68 75 71  Resp:  14 14 16   Temp:  98.6 F (37 C) 98.8 F (37.1  C) 98 F (36.7 C)  TempSrc:  Oral Oral Oral  SpO2: 100% 100% 100% 100%  Weight:    189 lb 14.4 oz (86.1 kg)  Height:    6\' 3"  (1.905 m)    Intake/Output Summary (Last 24 hours) at 05/11/17 0836 Last data filed at 05/10/17 1631  Gross per 24 hour  Intake                0 ml  Output              500 ml  Net             -500 ml   Filed Weights   05/11/17 0615  Weight: 189 lb 14.4 oz (86.1 kg)   Body mass index is 23.74 kg/m.  General:  Well nourished, well developed, in no acute distress HEENT: normal, glasses Lymph: no adenopathy Neck: no JVD Endocrine:  No thryomegaly Vascular: Rt CA bruit, no JVD Cardiac: RRR, 2/6 systolic murmur AOV with preserved S2.  Lungs:  Decreased breath sounds- poor effort Abd: soft, nontender, no hepatomegaly  Ext: no edema Musculoskeletal:  No deformities, BUE and BLE strength normal and equal Skin: warm and dry  Neuro:  CNs 2-12 intact, no  focal abnormalities noted,  slow deliberate movements Psych:  flat affect,  EKG:  The EKG was personally reviewed and demonstrates:  NSR, poor ant RW Telemetry:  Telemetry was personally reviewed and demonstrates:  NSR  Relevant CV Studies:   Laboratory Data:  Chemistry Recent Labs Lab 05/10/17 1136 05/11/17 0219  NA 140 138  K 4.1 3.9  CL 108 107  CO2 25 26  GLUCOSE 118* 106*  BUN 19 16  CREATININE 1.27* 0.85  CALCIUM 8.8* 8.7*  GFRNONAA 55* >60  GFRAA >60 >60  ANIONGAP 7 5     Recent Labs Lab 05/10/17 1136  PROT 6.4*  ALBUMIN 3.9  AST 16  ALT 15*  ALKPHOS 63  BILITOT 1.3*   Hematology Recent Labs Lab 05/10/17 1136 05/11/17 0219  WBC 3.2* 3.9*  RBC 2.50* 2.58*  HGB 9.0* 9.2*  HCT 27.1* 27.6*  MCV 108.4* 107.0*  MCH 36.0* 35.7*  MCHC 33.2 33.3  RDW 14.6 14.1  PLT 188 167   Cardiac Enzymes Recent Labs Lab 05/10/17 1237 05/10/17 1835 05/10/17 2158 05/11/17 0016  TROPONINI <0.03 <0.03 <0.03 <0.03   No results for input(s): TROPIPOC in the last 168 hours.  BNP Recent Labs Lab 05/10/17 1136  BNP 19.5    DDimer  Recent Labs Lab 05/10/17 1136  DDIMER 1.76*    Radiology/Studies:  Dg Chest 2 View  Result Date: 05/10/2017 CLINICAL DATA:  Larey Seat at home today, LEFT lower rib pain radiating to LEFT abdomen, history diabetes mellitus, hypertension EXAM: CHEST  2 VIEW COMPARISON:  None FINDINGS: Upper normal heart size. Mediastinal contours and pulmonary vascularity normal. Lungs clear. No pleural effusion or pneumothorax. Bones mildly demineralized but otherwise unremarkable. IMPRESSION: No acute abnormalities. Electronically Signed   By: Ulyses Southward M.D.   On: 05/10/2017 13:13   Ct Angio Chest Pe W Or Wo Contrast  Result Date: 05/10/2017 CLINICAL DATA:  71 year old male with history of left-sided chest, rib and upper abdominal pain for the past 2 days. EXAM: CT ANGIOGRAPHY CHEST WITH CONTRAST TECHNIQUE: Multidetector CT imaging of the chest was  performed using the standard protocol during bolus administration of intravenous contrast. Multiplanar CT image reconstructions and MIPs were obtained to evaluate the vascular anatomy. CONTRAST:  100 mL of  Isovue 370. COMPARISON:  None. FINDINGS: Cardiovascular: There are no filling defects within the pulmonary arterial tree to suggest underlying pulmonary embolism. Heart size is normal. There is no significant pericardial fluid, thickening or pericardial calcification. There is aortic atherosclerosis, as well as atherosclerosis of the great vessels of the mediastinum and the coronary arteries, including calcified atherosclerotic plaque in the left main, left anterior descending, left circumflex and right coronary arteries. Mediastinum/Nodes: No pathologically enlarged mediastinal or hilar lymph nodes. Esophagus is unremarkable in appearance. No axillary lymphadenopathy. Lungs/Pleura: No acute consolidative airspace disease. No pleural effusions. No suspicious appearing pulmonary nodules or masses. Upper Abdomen: Numerous partially calcified gallstones lying dependently in the gallbladder. Musculoskeletal: There are no aggressive appearing lytic or blastic lesions noted in the visualized portions of the skeleton. Review of the MIP images confirms the above findings. IMPRESSION: 1. No evidence of pulmonary embolism. 2. No acute findings in the thorax to account for the patient's symptoms. 3. Aortic atherosclerosis, in addition to left main and 3 vessel coronary artery disease. Assessment for potential risk factor modification, dietary therapy or pharmacologic therapy may be warranted, if clinically indicated. Aortic Atherosclerosis (ICD10-I70.0). Electronically Signed   By: Trudie Reed M.D.   On: 05/10/2017 15:01    Assessment and Plan:   Chest pain- Sounds atypical and MI r/o so far but history if difficult secondary to dementia  CAD- H/o Dx1 PCI in [place], 4V CAD on chest CTA  Parkinson's- Followed by  Dr Tat. 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 Im not sure much else needs to be done as far as an ischemic work up. We know he has CAD but he is most likely not a candidate for cath and it's not clear his symptoms were cardiac anyway. We could consider and echo to help with medication adjustment but I don't see that CHF has been an issue either.    Signed, Corine Shelter, PA-C  05/11/2017 8:36 AM    History and all data above reviewed.  Patient examined.  I agree with the findings as above.  The patient describes pain that is atypical left upper quadrant pain with no radiation.  He thought that it felt like previous "gas".  He reports that he is limited by his Parkinson's disease.  However he says that he cam ambulate to the dining hall at his nursing home without symptoms.  EKG this admission was normal and cardiac enzymes have been negative.   The patient exam reveals COR: RRR  ,  Lungs: Clear  ,  Abd: .bolwe, Ext no edema  .  All available labs, radiology testing, previous records reviewed. Agree with documented assessment and plan. Chest pain:  This was atypical greater than typical without objective evidence of ischemia.  He does have coronary calcium and a vague past history of PCI.  Echo is pending.  I would suggest that he can be risk stratified with an out patient [facility] and followed by Dr. Jens Som pending the results of the echo.   Fayrene Fearing Tremon Sainvil  10:18 AM  05/11/2017

## 2017-05-11 NOTE — Plan of Care (Signed)
Problem: Pain Managment: Goal: General experience of comfort will improve Outcome: Completed/Met Date Met: 05/11/17 Pt has remained Chest pain free-denies any other pain or complaints.

## 2017-05-11 NOTE — Progress Notes (Signed)
  Echocardiogram 2D Echocardiogram has been performed.  Gabriel Ramirez, Gabriel Ramirez 05/11/2017, 10:46 AM

## 2017-05-11 NOTE — Evaluation (Signed)
Physical Therapy Evaluation Patient Details Name: Gabriel Ramirez MRN: [id number] DOB: 1946-05-13 Today's Date: 05/11/2017   History of Present Illness  Pt is a 71 y/o male admitted secondary to chest pain. CT angio negative for PE. PMH includes CAD s/p stent placement, HTN, parkinson's diseases, DM, and dementia.   Clinical Impression  Pt admitted secondary to problem above with deficits below. PTA, pt was independent with functional mobility using a cane and lived in independent living facility. Called facility to determine baseline given pt cognitive deficits, and facility reports pt would request assist and then cancel before receiving. Upon eval, pt with cognitive deficits and requiring increased assist from baseline. Pt requiring min to max A for mobility this session. Recommending SNF at d/c to increase independence with functional mobility given current deficits prior to return to independent living facility. Will continue to follow acutely to maximize functional mobility independence.     Follow Up Recommendations SNF    Equipment Recommendations  None recommended by PT    Recommendations for Other Services OT consult     Precautions / Restrictions Precautions Precautions: Fall Restrictions Weight Bearing Restrictions: No      Mobility  Bed Mobility Overal bed mobility: Needs Assistance Bed Mobility: Supine to Sit;Sit to Supine     Supine to sit: Mod assist;Max assist Sit to supine: Mod assist   General bed mobility comments: Mod-max A for trunk elevation and scooting hips to EOB. Mod A for LE elevation upon return to supine.   Transfers Overall transfer level: Needs assistance Equipment used: Straight cane Transfers: Sit to/from Stand Sit to Stand: Mod assist;Min assist;From elevated surface         General transfer comment: Mod A to min A for steadying and lift assist. More assist requiring upon standing from bed vs. chair. Demonstrated posterior lean  initially, however, able to correct with verbal and manual cues.   Ambulation/Gait Ambulation/Gait assistance: Min assist Ambulation Distance (Feet): 25 Feet Assistive device: 1 person hand held assist;Straight cane Gait Pattern/deviations: Step-through pattern;Decreased stride length;Shuffle (parkinsonian gait. ) Gait velocity: Decreased Gait velocity interpretation: Below normal speed for age/gender General Gait Details: Slow, unsteady gait. Increased time required for gait initiation secondary to parkinson's disease. Carrying cane in hand initially, however, corrected upon cues. REquired HHA as well to increase stability.   Stairs            Wheelchair Mobility    Modified Rankin (Stroke Patients Only)       Balance Overall balance assessment: Needs assistance Sitting-balance support: No upper extremity supported;Feet supported Sitting balance-Leahy Scale: Fair     Standing balance support: Bilateral upper extremity supported;During functional activity Standing balance-Leahy Scale: Poor Standing balance comment: Reliant on UE and external assist.                              Pertinent Vitals/Pain Pain Assessment: No/denies pain    Home Living Family/patient expects to be discharged to:: Other (Comment) (Independent living )               Home Equipment: Gilmer Mor - single point Additional Comments: Called [facility], and reports, pt is in independent living. Reports there are services to assist as needed, however, pt continues to cancel assist.     Prior Function Level of Independence: Independent with assistive device(s)         Comments: Pt reports and facility confirmed, pt was no requesting or requiring assist  for ADLs and functional mobility. Used cane at baseline.      Hand Dominance   Dominant Hand: Right    Extremity/Trunk Assessment   Upper Extremity Assessment Upper Extremity Assessment: Generalized weakness    Lower  Extremity Assessment Lower Extremity Assessment: Generalized weakness (Grossly 3+/5 throughout. )    Cervical / Trunk Assessment Cervical / Trunk Assessment: Kyphotic  Communication   Communication: No difficulties  Cognition Arousal/Alertness: Awake/alert Behavior During Therapy: WFL for tasks assessed/performed Overall Cognitive Status: No family/caregiver present to determine baseline cognitive functioning                                 General Comments: Disoriented to time and place. Slowed cognitive processessing and STM deficits. Baseline dementia, however, unsure if pt at baseline.       General Comments General comments (skin integrity, edema, etc.): No family present during session.     Exercises     Assessment/Plan    PT Assessment Patient needs continued PT services  PT Problem List Decreased strength;Decreased balance;Decreased mobility;Decreased cognition;Decreased knowledge of use of DME;Decreased knowledge of precautions;Decreased safety awareness       PT Treatment Interventions DME instruction;Gait training;Functional mobility training;Therapeutic activities;Therapeutic exercise;Balance training;Neuromuscular re-education;Cognitive remediation;Patient/family education    PT Goals (Current goals can be found in the Care Plan section)  Acute Rehab PT Goals Patient Stated Goal: none stated PT Goal Formulation: With patient Time For Goal Achievement: 05/25/17 Potential to Achieve Goals: Fair    Frequency Min 2X/week   Barriers to discharge Decreased caregiver support      Co-evaluation               AM-PAC PT "6 Clicks" Daily Activity  Outcome Measure Difficulty turning over in bed (including adjusting bedclothes, sheets and blankets)?: Total Difficulty moving from lying on back to sitting on the side of the bed? : Total Difficulty sitting down on and standing up from a chair with arms (e.g., wheelchair, bedside commode, etc,.)?:  Total Help needed moving to and from a bed to chair (including a wheelchair)?: A Little Help needed walking in hospital room?: A Little Help needed climbing 3-5 steps with a railing? : A Lot 6 Click Score: 11    End of Session Equipment Utilized During Treatment: Gait belt Activity Tolerance: Patient tolerated treatment well Patient left: in bed;with call bell/phone within reach;with bed alarm set Nurse Communication: Mobility status PT Visit Diagnosis: Unsteadiness on feet (R26.81);Other abnormalities of gait and mobility (R26.89);Other symptoms and signs involving the nervous system (R29.898)    Time: [id number] PT Time Calculation (min) (ACUTE ONLY): 26 min   Charges:   PT Evaluation $PT Eval Moderate Complexity: 1 Mod PT Treatments $Gait Training: 8-22 mins   PT G Codes:   PT G-Codes **NOT FOR INPATIENT CLASS** Functional Assessment Tool Used: AM-PAC 6 Clicks Basic Mobility Functional Limitation: Mobility: Walking and moving around Mobility: Walking and Moving Around Current Status ([id number]): At least 60 percent but less than 80 percent impaired, limited or restricted Mobility: Walking and Moving Around Goal Status [phone]): At least 1 percent but less than 20 percent impaired, limited or restricted    Gladys DammeBrittany Jayzon Taras, PT, DPT  Acute Rehabilitation Services  Pager: [phone]   Lehman PromBrittany S Myrna Vonseggern 05/11/2017, 12:19 PM

## 2017-05-11 NOTE — Progress Notes (Addendum)
PROGRESS NOTE    Odus Clasby  ZOX:[id number] DOB: [birth year] DOA: 05/10/2017 PCP: Clovis Riley, L.August Saucer, MD   Outpatient Specialists:     Brief Narrative:  Gabriel Ramirez is a 71 y.o. male with medical history significant of parkinson's disease, CAD s/p stent ~6 years ago, HTN, DM, here for left chest pain.  Patient is a poor historian. Cites a few days of lower left chest pain. Sharp and stabbing, comes and goes. None currently. Provoked by certain movements, not necessarily walking. No associated sob. No cough. Otherwise feeling well. Afebrile. No leg swelling. No recent immobility. No recent med changes. No recent injuries or falls. Unsure what if anything relieves the pain. Denies melena/hematochezia   Assessment & Plan:   Active Problems:   Chest pain   Chest pain - atypical. Heart score 5.  -CE negative -echo: Normal LV systolic function; mild LVH; mild diastolic   dysfunction.; mild LAE. -seen by cardiology-- no further inpt eval-- outpatient stress test and follow up with Dr. Jens Som  Anemia -low B12 -replace IM and then PO per patient's request   AKI -  -resolved   DM  - sliding scale insulin  Parkinson's disease - continue home sinimet  HTN - at goal - continue home meds  Social: needs SNF per PT-- social work consult  DVT prophylaxis:  Lovenox   Code Status: DNR   Family Communication:   Disposition Plan:     Consultants:   cards    Subjective: No current chest pain  Objective: Vitals:   05/10/17 1645 05/10/17 1757 05/10/17 2011 05/11/17 0615  BP: (!) 152/85 (!) 152/76 (!) 141/66 (!) 151/95  Pulse: 75 68 75 71  Resp:  14 14 16   Temp:  98.6 F (37 C) 98.8 F (37.1 C) 98 F (36.7 C)  TempSrc:  Oral Oral Oral  SpO2: 100% 100% 100% 100%  Weight:    86.1 kg (189 lb 14.4 oz)  Height:    6\' 3"  (1.905 m)    Intake/Output Summary (Last 24 hours) at 05/11/17 1237 Last data filed at 05/11/17 0700  Gross per 24 hour  Intake               120 ml  Output              500 ml  Net             -380 ml   Filed Weights   05/11/17 0615  Weight: 86.1 kg (189 lb 14.4 oz)    Examination:  General exam: chronically ill appearing Respiratory system: clear Cardiovascular system: rrr Gastrointestinal system: +BS, soft Central nervous system: alert- speech difficult to understand Extremities: weak     Data Reviewed: I have personally reviewed following labs and imaging studies  CBC:  Recent Labs Lab 05/10/17 1136 05/11/17 0219  WBC 3.2* 3.9*  NEUTROABS 2.1  --   HGB 9.0* 9.2*  HCT 27.1* 27.6*  MCV 108.4* 107.0*  PLT 188 167   Basic Metabolic Panel:  Recent Labs Lab 05/10/17 1136 05/11/17 0219  NA 140 138  K 4.1 3.9  CL 108 107  CO2 25 26  GLUCOSE 118* 106*  BUN 19 16  CREATININE 1.27* 0.85  CALCIUM 8.8* 8.7*   GFR: Estimated Creatinine Clearance: 93.9 mL/min (by C-G formula based on SCr of 0.85 mg/dL). Liver Function Tests:  Recent Labs Lab 05/10/17 1136  AST 16  ALT 15*  ALKPHOS 63  BILITOT 1.3*  PROT 6.4*  ALBUMIN 3.9  No results for input(s): LIPASE, AMYLASE in the last 168 hours. No results for input(s): AMMONIA in the last 168 hours. Coagulation Profile: No results for input(s): INR, PROTIME in the last 168 hours. Cardiac Enzymes:  Recent Labs Lab 05/10/17 1237 05/10/17 1835 05/10/17 2158 05/11/17 0016  TROPONINI <0.03 <0.03 <0.03 <0.03   BNP (last 3 results) No results for input(s): PROBNP in the last 8760 hours. HbA1C:  Recent Labs  05/10/17 1835  HGBA1C 5.7*   CBG:  Recent Labs Lab 05/10/17 1752 05/10/17 2009 05/11/17 0740 05/11/17 1141  GLUCAP 93 169* 91 126*   Lipid Profile:  Recent Labs  05/10/17 1835  CHOL 115  HDL 55  LDLCALC 55  TRIG 26  CHOLHDL 2.1   Thyroid Function Tests: No results for input(s): TSH, T4TOTAL, FREET4, T3FREE, THYROIDAB in the last 72 hours. Anemia Panel:  Recent Labs  05/10/17 1835  VITAMINB12 62*    FOLATE 31.3  FERRITIN 286  TIBC 272  IRON 86   Urine analysis:    Component Value Date/Time   COLORURINE YELLOW 05/10/2017 1438   APPEARANCEUR CLEAR 05/10/2017 1438   LABSPEC 1.029 05/10/2017 1438   PHURINE 6.0 05/10/2017 1438   GLUCOSEU NEGATIVE 05/10/2017 1438   HGBUR NEGATIVE 05/10/2017 1438   BILIRUBINUR NEGATIVE 05/10/2017 1438   KETONESUR NEGATIVE 05/10/2017 1438   PROTEINUR NEGATIVE 05/10/2017 1438   NITRITE NEGATIVE 05/10/2017 1438   LEUKOCYTESUR NEGATIVE 05/10/2017 1438     )No results found for this or any previous visit (from the past 240 hour(s)).    Anti-infectives    None       Radiology Studies: Dg Chest 2 View  Result Date: 05/10/2017 CLINICAL DATA:  Larey SeatFell at home today, LEFT lower rib pain radiating to LEFT abdomen, history diabetes mellitus, hypertension EXAM: CHEST  2 VIEW COMPARISON:  None FINDINGS: Upper normal heart size. Mediastinal contours and pulmonary vascularity normal. Lungs clear. No pleural effusion or pneumothorax. Bones mildly demineralized but otherwise unremarkable. IMPRESSION: No acute abnormalities. Electronically Signed   By: Ulyses SouthwardMark  Boles M.D.   On: 05/10/2017 13:13   Ct Angio Chest Pe W Or Wo Contrast  Result Date: 05/10/2017 CLINICAL DATA:  71 year old male with history of left-sided chest, rib and upper abdominal pain for the past 2 days. EXAM: CT ANGIOGRAPHY CHEST WITH CONTRAST TECHNIQUE: Multidetector CT imaging of the chest was performed using the standard protocol during bolus administration of intravenous contrast. Multiplanar CT image reconstructions and MIPs were obtained to evaluate the vascular anatomy. CONTRAST:  100 mL of Isovue 370. COMPARISON:  None. FINDINGS: Cardiovascular: There are no filling defects within the pulmonary arterial tree to suggest underlying pulmonary embolism. Heart size is normal. There is no significant pericardial fluid, thickening or pericardial calcification. There is aortic atherosclerosis, as well  as atherosclerosis of the great vessels of the mediastinum and the coronary arteries, including calcified atherosclerotic plaque in the left main, left anterior descending, left circumflex and right coronary arteries. Mediastinum/Nodes: No pathologically enlarged mediastinal or hilar lymph nodes. Esophagus is unremarkable in appearance. No axillary lymphadenopathy. Lungs/Pleura: No acute consolidative airspace disease. No pleural effusions. No suspicious appearing pulmonary nodules or masses. Upper Abdomen: Numerous partially calcified gallstones lying dependently in the gallbladder. Musculoskeletal: There are no aggressive appearing lytic or blastic lesions noted in the visualized portions of the skeleton. Review of the MIP images confirms the above findings. IMPRESSION: 1. No evidence of pulmonary embolism. 2. No acute findings in the thorax to account for the patient's symptoms. 3. Aortic  atherosclerosis, in addition to left main and 3 vessel coronary artery disease. Assessment for potential risk factor modification, dietary therapy or pharmacologic therapy may be warranted, if clinically indicated. Aortic Atherosclerosis (ICD10-I70.0). Electronically Signed   By: Trudie Reed M.D.   On: 05/10/2017 15:01        Scheduled Meds: . amLODipine  5 mg Oral Daily  . aspirin  324 mg Oral Once  . aspirin EC  81 mg Oral Daily  . atorvastatin  20 mg Oral QHS  . carbidopa-levodopa  1 tablet Oral QHS  . cyanocobalamin  1,000 mcg Intramuscular Daily  . enoxaparin (LOVENOX) injection  40 mg Subcutaneous Q24H  . gabapentin  600 mg Oral QHS  . insulin aspart  0-15 Units Subcutaneous TID WC  . quinapril  20 mg Oral QHS   Continuous Infusions:   LOS: 0 days    Time spent: 35 min    JESSICA U VANN, DO [facility] Pager [phone]  If 7PM-7AM, please contact night-coverage [email or web address] Password [facility] 05/11/2017, 12:37 PM

## 2017-05-12 ENCOUNTER — Encounter (HOSPITAL_COMMUNITY): Payer: Self-pay | Admitting: General Practice

## 2017-05-12 DIAGNOSIS — I10 Essential (primary) hypertension: Secondary | ICD-10-CM

## 2017-05-12 DIAGNOSIS — G20 Parkinson's disease: Secondary | ICD-10-CM | POA: Diagnosis not present

## 2017-05-12 DIAGNOSIS — R079 Chest pain, unspecified: Secondary | ICD-10-CM | POA: Diagnosis not present

## 2017-05-12 LAB — GLUCOSE, CAPILLARY
Glucose-Capillary: 102 mg/dL — ABNORMAL HIGH (ref 65–99)
Glucose-Capillary: 122 mg/dL — ABNORMAL HIGH (ref 65–99)

## 2017-05-12 MED ORDER — AMLODIPINE BESYLATE 2.5 MG PO TABS: 7.5000 mg | ORAL_TABLET | Freq: Every day | ORAL | 0 refills | Status: DC

## 2017-05-12 MED ORDER — B-12 (METHYLCOBALAMIN) 1000 MCG SL SUBL: 1.0000 | SUBLINGUAL_TABLET | Freq: Every day | SUBLINGUAL | 0 refills | Status: AC

## 2017-05-12 MED ORDER — AMLODIPINE BESYLATE 2.5 MG PO TABS: 7.5000 mg | ORAL_TABLET | Freq: Every day | ORAL | Status: DC

## 2017-05-12 NOTE — Progress Notes (Signed)
[facility] per patient and patient family member request.

## 2017-05-12 NOTE — Discharge Summary (Signed)
Physician Discharge Summary  Gabriel Ramirez ZOX:[id number] DOB: 1946-02-06 DOA: 05/10/2017  PCP: Clovis Riley, L.August Saucer, MD  Admit date: 05/10/2017 Discharge date: 05/12/2017   Recommendations for Outpatient Follow-Up:   Home health Palliative care referral DNR Follow B12 level  Discharge Diagnosis:   Active Problems:   Essential hypertension   Hyperlipidemia   Parkinson's disease (HCC)   Chest pain   Discharge disposition:  ILF  Discharge Condition: Improved.  Diet recommendation: Low sodium, heart healthy  Wound care: None.   History of Present Illness:   Gabriel Ramirez is a 71 y.o. male with medical history significant of parkinson's disease, CAD s/p stent ~6 years ago, HTN, DM, here for left chest pain.   Patient is a poor historian. Cites a few days of lower left chest pain. Sharp and stabbing, comes and goes. None currently. Provoked by certain movements, not necessarily walking. No associated sob. No cough. Otherwise feeling well. Afebrile. No leg swelling. No recent immobility. No recent med changes. No recent injuries or falls. Unsure what if anything relieves the pain. Denies melena/hematochezia   Hospital Course by Problem:   Chest pain - atypical. Heart score 5.  -CE negative -echo: Normal LV systolic function; mild LVH; mild diastolic dysfunction.; mild LAE. -seen by cardiology-- no further inpt eval-- outpatient stress test and follow up with Dr. Jens Som  Anemia -low B12 -replace IM and then PO per patient's request- will need outpatient follow up   AKI -  -resolved   DM  - resume home meds  Parkinson's disease - continue home sinimet  HTN - at goal - continue home meds     Medical Consultants:    cards   Discharge Exam:   Vitals:   05/12/17 1013 05/12/17 1345  BP: (!) 152/78 124/66  Pulse:  67  Resp:  18  Temp:  98.8 F (37.1 C)  SpO2:  100%   Vitals:   05/11/17 1958 05/12/17 0434 05/12/17 1013 05/12/17 1345  BP:  136/71 (!) 159/71 (!) 152/78 124/66  Pulse: 70 67  67  Resp: 18 16  18   Temp: 98.2 F (36.8 C) 98 F (36.7 C)  98.8 F (37.1 C)  TempSrc: Oral Oral  Oral  SpO2: 100% 100%  100%  Weight:  85.9 kg (189 lb 4.8 oz)    Height:           The results of significant diagnostics from this hospitalization (including imaging, microbiology, ancillary and laboratory) are listed below for reference.     Procedures and Diagnostic Studies:   Dg Chest 2 View  Result Date: 05/10/2017 CLINICAL DATA:  Larey Seat at home today, LEFT lower rib pain radiating to LEFT abdomen, history diabetes mellitus, hypertension EXAM: CHEST  2 VIEW COMPARISON:  None FINDINGS: Upper normal heart size. Mediastinal contours and pulmonary vascularity normal. Lungs clear. No pleural effusion or pneumothorax. Bones mildly demineralized but otherwise unremarkable. IMPRESSION: No acute abnormalities. Electronically Signed   By: Ulyses Southward M.D.   On: 05/10/2017 13:13   Ct Angio Chest Pe W Or Wo Contrast  Result Date: 05/10/2017 CLINICAL DATA:  71 year old male with history of left-sided chest, rib and upper abdominal pain for the past 2 days. EXAM: CT ANGIOGRAPHY CHEST WITH CONTRAST TECHNIQUE: Multidetector CT imaging of the chest was performed using the standard protocol during bolus administration of intravenous contrast. Multiplanar CT image reconstructions and MIPs were obtained to evaluate the vascular anatomy. CONTRAST:  100 mL of Isovue 370. COMPARISON:  None. FINDINGS: Cardiovascular: There are  no filling defects within the pulmonary arterial tree to suggest underlying pulmonary embolism. Heart size is normal. There is no significant pericardial fluid, thickening or pericardial calcification. There is aortic atherosclerosis, as well as atherosclerosis of the great vessels of the mediastinum and the coronary arteries, including calcified atherosclerotic plaque in the left main, left anterior descending, left circumflex and right  coronary arteries. Mediastinum/Nodes: No pathologically enlarged mediastinal or hilar lymph nodes. Esophagus is unremarkable in appearance. No axillary lymphadenopathy. Lungs/Pleura: No acute consolidative airspace disease. No pleural effusions. No suspicious appearing pulmonary nodules or masses. Upper Abdomen: Numerous partially calcified gallstones lying dependently in the gallbladder. Musculoskeletal: There are no aggressive appearing lytic or blastic lesions noted in the visualized portions of the skeleton. Review of the MIP images confirms the above findings. IMPRESSION: 1. No evidence of pulmonary embolism. 2. No acute findings in the thorax to account for the patient's symptoms. 3. Aortic atherosclerosis, in addition to left main and 3 vessel coronary artery disease. Assessment for potential risk factor modification, dietary therapy or pharmacologic therapy may be warranted, if clinically indicated. Aortic Atherosclerosis (ICD10-I70.0). Electronically Signed   By: Trudie Reedaniel  Entrikin M.D.   On: 05/10/2017 15:01     Labs:   Basic Metabolic Panel:  Recent Labs Lab 05/10/17 1136 05/11/17 0219  NA 140 138  K 4.1 3.9  CL 108 107  CO2 25 26  GLUCOSE 118* 106*  BUN 19 16  CREATININE 1.27* 0.85  CALCIUM 8.8* 8.7*   GFR Estimated Creatinine Clearance: 93.9 mL/min (by C-G formula based on SCr of 0.85 mg/dL). Liver Function Tests:  Recent Labs Lab 05/10/17 1136  AST 16  ALT 15*  ALKPHOS 63  BILITOT 1.3*  PROT 6.4*  ALBUMIN 3.9   No results for input(s): LIPASE, AMYLASE in the last 168 hours. No results for input(s): AMMONIA in the last 168 hours. Coagulation profile No results for input(s): INR, PROTIME in the last 168 hours.  CBC:  Recent Labs Lab 05/10/17 1136 05/11/17 0219  WBC 3.2* 3.9*  NEUTROABS 2.1  --   HGB 9.0* 9.2*  HCT 27.1* 27.6*  MCV 108.4* 107.0*  PLT 188 167   Cardiac Enzymes:  Recent Labs Lab 05/10/17 1237 05/10/17 1835 05/10/17 2158 05/11/17 0016   TROPONINI <0.03 <0.03 <0.03 <0.03   BNP: Invalid input(s): POCBNP CBG:  Recent Labs Lab 05/11/17 1141 05/11/17 1620 05/11/17 1956 05/12/17 0736 05/12/17 1130  GLUCAP 126* 103* 134* 102* 122*   D-Dimer  Recent Labs  05/10/17 1136  DDIMER 1.76*   Hgb A1c  Recent Labs  05/10/17 1835  HGBA1C 5.7*   Lipid Profile  Recent Labs  05/10/17 1835  CHOL 115  HDL 55  LDLCALC 55  TRIG 26  CHOLHDL 2.1   Thyroid function studies No results for input(s): TSH, T4TOTAL, T3FREE, THYROIDAB in the last 72 hours.  Invalid input(s): FREET3 Anemia work up  Recent Labs  05/10/17 1835  VITAMINB12 62*  FOLATE 31.3  FERRITIN 286  TIBC 272  IRON 86   Microbiology No results found for this or any previous visit (from the past 240 hour(s)).   Discharge Instructions:   Discharge Instructions    Diet - low sodium heart healthy    Complete by:  As directed    Discharge instructions    Complete by:  As directed    Home health   Increase activity slowly    Complete by:  As directed      Allergies as of 05/12/2017  No Known Allergies     Medication List    STOP taking these medications   ondansetron 4 MG tablet Commonly known as:  ZOFRAN   zolpidem 10 MG tablet Commonly known as:  AMBIEN     TAKE these medications   Abdominal Binder/Elastic Large Misc 1 Device by Does not apply route daily. DX: i95.1   amLODipine 2.5 MG tablet Commonly known as:  NORVASC Take 3 tablets (7.5 mg total) by mouth daily. What changed:  medication strength  how much to take   aspirin EC 81 MG tablet Take 81 mg by mouth daily.   atorvastatin 20 MG tablet Commonly known as:  LIPITOR Take 20 mg by mouth at bedtime.   B-12 (Methylcobalamin) 1000 MCG Subl Place 1 tablet under the tongue daily.   Carbidopa-Levodopa ER 25-100 MG tablet controlled release Commonly known as:  SINEMET CR Take 3 tablets by mouth 3 (three) times daily.   carbidopa-levodopa 50-200 MG  tablet Commonly known as:  SINEMET CR Take 1 tablet by mouth at bedtime.   colchicine 0.6 MG tablet Take 0.6 mg by mouth every 8 (eight) hours as needed (for gout flares).   gabapentin 300 MG capsule Commonly known as:  NEURONTIN Take 600 mg by mouth at bedtime.   quinapril 20 MG tablet Commonly known as:  ACCUPRIL Take 20 mg by mouth at bedtime.   sitaGLIPtin 50 MG tablet Commonly known as:  JANUVIA Take 50 mg by mouth daily.      Follow-up Information    Azalee Course, [place] Follow up on 05/28/2017.   Specialties:  Cardiology, Radiology Why:  1:30 pm hospital follow up Contact information: [address] [place] [address] [phone]        [facility] Follow up.   Why:  [facility] to provide PT/OT Services. Aide to assist with medications.  Contact information:  [address], [place], [place] [address] Phone: [phone]       Clovis Riley, L.August Saucer, MD Follow up in 1 week(s).   Specialty:  Family Medicine Why:  follow up B12 level Contact information: [address] [place] [address] [phone]            Time coordinating discharge: 35 min  Signed:  JESSICA Juanetta Gosling   [facility] 05/12/2017, 4:10 PM

## 2017-05-12 NOTE — Progress Notes (Signed)
Physical Therapy Treatment Patient Details Name: Gabriel Ramirez MRN: [id number] DOB: 05/22/1946 Today's Date: 05/12/2017    History of Present Illness Pt is a 71 y/o male admitted secondary to chest pain. CT angio negative for PE. PMH includes CAD s/p stent placement, HTN, parkinson's diseases, DM, and dementia.     PT Comments    Pt making good progress with mobility. Still requiring assistance with mobility for safety and balance.    Follow Up Recommendations  SNF     Equipment Recommendations  None recommended by PT    Recommendations for Other Services       Precautions / Restrictions Precautions Precautions: Fall Restrictions Weight Bearing Restrictions: No    Mobility  Bed Mobility Overal bed mobility: Needs Assistance Bed Mobility: Supine to Sit     Supine to sit: Supervision;HOB elevated     General bed mobility comments: Incr time  Transfers Overall transfer level: Needs assistance Equipment used: Rolling walker (2 wheeled) Transfers: Sit to/from Stand Sit to Stand: Min assist         General transfer comment: assist to bring hips up and for balance. Pt with initial posterior lean against bed or chair.  Ambulation/Gait Ambulation/Gait assistance: Min guard Ambulation Distance (Feet): 150 Feet (x 2) Assistive device: Rolling walker (2 wheeled);Straight cane Gait Pattern/deviations: Step-through pattern;Decreased stride length;Trunk flexed;Shuffle Gait velocity: decr Gait velocity interpretation: Below normal speed for age/gender General Gait Details: Assist for safety. Pt used rolling walker on first amb and then cane on second. Slightly unsteady but no loss of balance.   Stairs            Wheelchair Mobility    Modified Rankin (Stroke Patients Only)       Balance Overall balance assessment: Needs assistance Sitting-balance support: No upper extremity supported;Feet supported Sitting balance-Leahy Scale: Fair     Standing  balance support: No upper extremity supported Standing balance-Leahy Scale: Fair                              Cognition Arousal/Alertness: Awake/alert Behavior During Therapy: Flat affect Overall Cognitive Status: No family/caregiver present to determine baseline cognitive functioning                                 General Comments: baseline dementia      Exercises      General Comments        Pertinent Vitals/Pain Pain Assessment: No/denies pain    Home Living Family/patient expects to be discharged to:: Unsure Living Arrangements: Other relatives                  Prior Function            PT Goals (current goals can now be found in the care plan section) Progress towards PT goals: Progressing toward goals    Frequency    Min 2X/week      PT Plan Current plan remains appropriate    Co-evaluation              AM-PAC PT "6 Clicks" Daily Activity  Outcome Measure  Difficulty turning over in bed (including adjusting bedclothes, sheets and blankets)?: A Little Difficulty moving from lying on back to sitting on the side of the bed? : A Little Difficulty sitting down on and standing up from a chair with arms (e.g., wheelchair, bedside commode, etc,.)?: Total  Help needed moving to and from a bed to chair (including a wheelchair)?: A Little Help needed walking in hospital room?: A Little Help needed climbing 3-5 steps with a railing? : A Lot 6 Click Score: 15    End of Session Equipment Utilized During Treatment: Gait belt Activity Tolerance: Patient tolerated treatment well Patient left: in chair;with call bell/phone within reach;with chair alarm set;with family/visitor present Nurse Communication: Mobility status PT Visit Diagnosis: Unsteadiness on feet (R26.81)     Time: 1610-9604 PT Time Calculation (min) (ACUTE ONLY): 18 min  Charges:  $Gait Training: 8-22 mins                    G Codes:       [facility]  PT [phone]    Angelina Ok [facility] 05/12/2017, 2:27 PM

## 2017-05-12 NOTE — Evaluation (Addendum)
Occupational Therapy Evaluation Patient Details Name: Gabriel Ramirez MRN: [id number] DOB: Oct 03, 1945 Today's Date: 05/12/2017    History of Present Illness Pt is a 71 y/o male admitted secondary to chest pain. CT angio negative for PE. PMH includes CAD s/p stent placement, HTN, parkinson's diseases, DM, and dementia.    Clinical Impression   [place] and was independent in self care and walked with a cane. He reports receiving 2 meals in the facility dining room. Pt presents with impaired cognition and standing balance. He needs 24 hour close supervision. Recommending SNF at this point as pt does not have any family or friend support. Will follow acutely.    Follow Up Recommendations  SNF;Supervision/Assistance - 24 hour    Equipment Recommendations       Recommendations for Other Services       Precautions / Restrictions Precautions Precautions: Fall Restrictions Weight Bearing Restrictions: No      Mobility Bed Mobility   Bed Mobility: Supine to Sit     Supine to sit: Supervision     General bed mobility comments: increased time, no assist  Transfers Overall transfer level: Needs assistance Equipment used: Rolling walker (2 wheeled) Transfers: Sit to/from Stand Sit to Stand: Supervision         General transfer comment: supervision for safety    Balance     Sitting balance-Leahy Scale: Fair       Standing balance-Leahy Scale: Fair Standing balance comment: able to release walker in static standing during ADL                           ADL either performed or assessed with clinical judgement   ADL Overall ADL's : Needs assistance/impaired Eating/Feeding: Independent;Sitting   Grooming: Wash/dry hands;Standing;Minimal assistance Grooming Details (indicate cue type and reason): verbal cues for sequencing, left water running Upper Body Bathing: Minimal assistance;Sitting   Lower Body Bathing: Minimal assistance;Sit  to/from stand   Upper Body Dressing : Set up;Sitting   Lower Body Dressing: Minimal assistance;Sit to/from stand   Toilet Transfer: Supervision/safety;Ambulation;RW Toilet Transfer Details (indicate cue type and reason): stood in bathroom and used urinal  Toileting- Clothing Manipulation and Hygiene: Minimal assistance;Sit to/from stand Toileting - Clothing Manipulation Details (indicate cue type and reason): assist for gown     Functional mobility during ADLs: Minimal assistance;Supervision/safety (hand held assist vs supervision with RW)       Vision Baseline Vision/History: Wears glasses Wears Glasses: At all times Patient Visual Report: No change from baseline       Perception     Praxis      Pertinent Vitals/Pain Pain Assessment: No/denies pain     Hand Dominance Right   Extremity/Trunk Assessment Upper Extremity Assessment Upper Extremity Assessment: Overall WFL for tasks assessed   Lower Extremity Assessment Lower Extremity Assessment: Defer to PT evaluation   Cervical / Trunk Assessment Cervical / Trunk Assessment: Kyphotic   Communication Communication Communication: No difficulties   Cognition Arousal/Alertness: Awake/alert Behavior During Therapy: Flat affect Overall Cognitive Status: No family/caregiver present to determine baseline cognitive functioning                                 General Comments: Disoriented to time and place. Slowed cognitive processessing and STM deficits. Baseline dementia, however, unsure if pt at baseline.    General Comments  Exercises     Shoulder Instructions      Home Living Family/patient expects to be discharged to:: Private residence Living Arrangements: Alone   Type of Home: Independent living facility Home Access: Level entry     Home Layout: One level     Bathroom Shower/Tub: Producer, television/film/videoWalk-in shower   Bathroom Toilet: Standard     Home Equipment: Gilmer MorCane - single point   Additional  Comments: [facility] [employer], and reports, pt is in independent living. Reports there are services to assist as needed, however, pt continues to cancel assist.       Prior Functioning/Environment Level of Independence: Independent with assistive device(s)  Gait / Transfers Assistance Needed: walks with cane ADL's / Homemaking Assistance Needed: reports having 2 meals at his facility and a nurse came out to help him with his medications, unclear if this is ongoing.            OT Problem List: Impaired balance (sitting and/or standing);Decreased cognition;Decreased safety awareness;Decreased knowledge of use of DME or AE      OT Treatment/Interventions: Self-care/ADL training;DME and/or AE instruction;Therapeutic activities;Cognitive remediation/compensation;Patient/family education;Balance training    OT Goals(Current goals can be found in the care plan section) Acute Rehab OT Goals Patient Stated Goal: to go home OT Goal Formulation: With patient Time For Goal Achievement: 05/26/17 Potential to Achieve Goals: Good ADL Goals Pt Will Perform Grooming: (P) standing;with supervision Pt Will Perform Lower Body Bathing: (P) with supervision;sit to/from stand Pt Will Perform Lower Body Dressing: (P) with supervision;sit to/from stand Pt Will Transfer to Toilet: (P) with supervision;ambulating Pt Will Perform Toileting - Clothing Manipulation and hygiene: (P) with supervision;sit to/from stand  OT Frequency: Min 2X/week   Barriers to D/C: Decreased caregiver support          Co-evaluation              AM-PAC PT "6 Clicks" Daily Activity     Outcome Measure Help from another person eating meals?: None Help from another person taking care of personal grooming?: A Little Help from another person toileting, which includes using toliet, bedpan, or urinal?: A Little Help from another person bathing (including washing, rinsing, drying)?: A Little Help from another person to put  on and taking off regular upper body clothing?: None Help from another person to put on and taking off regular lower body clothing?: A Little 6 Click Score: 20   End of Session Equipment Utilized During Treatment: Gait belt;Rolling walker  Activity Tolerance: Patient tolerated treatment well Patient left: in bed;with call bell/phone within reach;with nursing/sitter in room (nursing giving meds at EOB)  OT Visit Diagnosis: Unsteadiness on feet (R26.81);Other abnormalities of gait and mobility (R26.89);Other symptoms and signs involving cognitive function                Time: [id number] OT Time Calculation (min): 17 min Charges:  OT General Charges $OT Visit: 1 Procedure OT Evaluation $OT Eval Moderate Complexity: 1 Procedure G-Codes: OT G-codes **NOT FOR INPATIENT CLASS** Functional Assessment Tool Used: Clinical judgement Functional Limitation: Self care Self Care Current Status (Z3664(G8987): At least 20 percent but less than 40 percent impaired, limited or restricted Self Care Goal Status (Q0347(G8988): At least 1 percent but less than 20 percent impaired, limited or restricted    Evern BioMayberry, Charitie Hinote Lynn 05/12/2017, 10:28 AM  [phone]

## 2017-05-12 NOTE — Discharge Instructions (Signed)

## 2017-05-12 NOTE — Progress Notes (Signed)
Progress Note  Patient Name: Gabriel Ramirez Date of Encounter: 05/12/2017  Primary Cardiologist: Dr. Jens Somrenshaw  Subjective   Patient is feeling well; denies chest pain, SOB, and palpitations.  Inpatient Medications    Scheduled Meds: . amLODipine  5 mg Oral Daily  . aspirin  324 mg Oral Once  . aspirin EC  81 mg Oral Daily  . atorvastatin  20 mg Oral QHS  . carbidopa-levodopa  1 tablet Oral QHS  . cyanocobalamin  1,000 mcg Intramuscular Daily  . enoxaparin (LOVENOX) injection  40 mg Subcutaneous Q24H  . gabapentin  600 mg Oral QHS  . insulin aspart  0-15 Units Subcutaneous TID WC  . quinapril  20 mg Oral QHS   Continuous Infusions:  PRN Meds: acetaminophen, ondansetron (ZOFRAN) IV   Vital Signs    Vitals:   05/11/17 1400 05/11/17 1958 05/12/17 0434 05/12/17 1013  BP: (!) 152/76 136/71 (!) 159/71 (!) 152/78  Pulse:  70 67   Resp: 20 18 16    Temp: 97.9 F (36.6 C) 98.2 F (36.8 C) 98 F (36.7 C)   TempSrc: Oral Oral Oral   SpO2:  100% 100%   Weight:   189 lb 4.8 oz (85.9 kg)   Height:        Intake/Output Summary (Last 24 hours) at 05/12/17 1117 Last data filed at 05/12/17 0800  Gross per 24 hour  Intake              870 ml  Output              800 ml  Net               70 ml   Filed Weights   05/11/17 0615 05/12/17 0434  Weight: 189 lb 14.4 oz (86.1 kg) 189 lb 4.8 oz (85.9 kg)     Physical Exam   General: Well developed, well nourished, male appearing in no acute distress. Head: Normocephalic, atraumatic.  Neck: Supple without bruits, no JVD. Lungs:  Resp regular and unlabored, CTA. Heart: RRR, S1, S2, no S3, S4, or murmur; no rub. Abdomen: Soft, non-tender, non-distended with normoactive bowel sounds. No hepatomegaly. No rebound/guarding. No obvious abdominal masses. Extremities: No clubbing, cyanosis, no edema. Distal pedal pulses are 2+ bilaterally. Neuro: Alert and oriented X 3. Moves all extremities spontaneously. Psych: Normal  affect.  Labs    Chemistry Recent Labs Lab 05/10/17 1136 05/11/17 0219  NA 140 138  K 4.1 3.9  CL 108 107  CO2 25 26  GLUCOSE 118* 106*  BUN 19 16  CREATININE 1.27* 0.85  CALCIUM 8.8* 8.7*  PROT 6.4*  --   ALBUMIN 3.9  --   AST 16  --   ALT 15*  --   ALKPHOS 63  --   BILITOT 1.3*  --   GFRNONAA 55* >60  GFRAA >60 >60  ANIONGAP 7 5     Hematology Recent Labs Lab 05/10/17 1136 05/11/17 0219  WBC 3.2* 3.9*  RBC 2.50* 2.58*  HGB 9.0* 9.2*  HCT 27.1* 27.6*  MCV 108.4* 107.0*  MCH 36.0* 35.7*  MCHC 33.2 33.3  RDW 14.6 14.1  PLT 188 167    Cardiac Enzymes Recent Labs Lab 05/10/17 1237 05/10/17 1835 05/10/17 2158 05/11/17 0016  TROPONINI <0.03 <0.03 <0.03 <0.03   No results for input(s): TROPIPOC in the last 168 hours.   BNP Recent Labs Lab 05/10/17 1136  BNP 19.5     DDimer  Recent Labs Lab 05/10/17  1136  DDIMER 1.76*     Radiology    Dg Chest 2 View  Result Date: 05/10/2017 CLINICAL DATA:  Larey Seat at home today, LEFT lower rib pain radiating to LEFT abdomen, history diabetes mellitus, hypertension EXAM: CHEST  2 VIEW COMPARISON:  None FINDINGS: Upper normal heart size. Mediastinal contours and pulmonary vascularity normal. Lungs clear. No pleural effusion or pneumothorax. Bones mildly demineralized but otherwise unremarkable. IMPRESSION: No acute abnormalities. Electronically Signed   By: Ulyses Southward M.D.   On: 05/10/2017 13:13   Ct Angio Chest Pe W Or Wo Contrast  Result Date: 05/10/2017 CLINICAL DATA:  71 year old male with history of left-sided chest, rib and upper abdominal pain for the past 2 days. EXAM: CT ANGIOGRAPHY CHEST WITH CONTRAST TECHNIQUE: Multidetector CT imaging of the chest was performed using the standard protocol during bolus administration of intravenous contrast. Multiplanar CT image reconstructions and MIPs were obtained to evaluate the vascular anatomy. CONTRAST:  100 mL of Isovue 370. COMPARISON:  None. FINDINGS:  Cardiovascular: There are no filling defects within the pulmonary arterial tree to suggest underlying pulmonary embolism. Heart size is normal. There is no significant pericardial fluid, thickening or pericardial calcification. There is aortic atherosclerosis, as well as atherosclerosis of the great vessels of the mediastinum and the coronary arteries, including calcified atherosclerotic plaque in the left main, left anterior descending, left circumflex and right coronary arteries. Mediastinum/Nodes: No pathologically enlarged mediastinal or hilar lymph nodes. Esophagus is unremarkable in appearance. No axillary lymphadenopathy. Lungs/Pleura: No acute consolidative airspace disease. No pleural effusions. No suspicious appearing pulmonary nodules or masses. Upper Abdomen: Numerous partially calcified gallstones lying dependently in the gallbladder. Musculoskeletal: There are no aggressive appearing lytic or blastic lesions noted in the visualized portions of the skeleton. Review of the MIP images confirms the above findings. IMPRESSION: 1. No evidence of pulmonary embolism. 2. No acute findings in the thorax to account for the patient's symptoms. 3. Aortic atherosclerosis, in addition to left main and 3 vessel coronary artery disease. Assessment for potential risk factor modification, dietary therapy or pharmacologic therapy may be warranted, if clinically indicated. Aortic Atherosclerosis (ICD10-I70.0). Electronically Signed   By: Trudie Reed M.D.   On: 05/10/2017 15:01     Telemetry    No tele  ECG    No new tracings - Personally Reviewed   Cardiac Studies   Echocardiogram 05/11/17 Study Conclusions - Left ventricle: The cavity size was normal. Wall thickness was   increased in a pattern of mild LVH. Systolic function was normal.   The estimated ejection fraction was in the range of 55% to 60%.   Wall motion was normal; there were no regional wall motion   abnormalities. Doppler parameters  are consistent with abnormal   left ventricular relaxation (grade 1 diastolic dysfunction). - Left atrium: The atrium was mildly dilated. - Atrial septum: There was an atrial septal aneurysm.  Impressions: - Normal LV systolic function; mild LVH; mild diastolic   dysfunction.; mild LAE.  Patient Profile     71 y.o. male with a hx of CAD who is being seen for the evaluation of chest pain   Assessment & Plan    1. Chest pain, CAD s/p PCI in [place], 4 vessel disease by CTA chest (including left main) on 05/10/17 - troponin remained negative overnight - no further chest pain - continue ASA - will see OP to evaluate need for further ischemic evaluation   2. HTN - hypertensive inpatient in the 150s - home meds  norvasc, quinapril have been continued   3. Parkinson's, dementia - per primary team   Signed, Marcelino Duster , PA-C 11:17 AM 05/12/2017 Pager: [phone]  History and all data above reviewed.  Patient examined.  I agree with the findings as above.  The patient exam reveals COR:RRR  ,  Lungs: Clear  ,  Abd: Positive bowel sounds, no rebound no guarding, Ext No edema  .  All available labs, radiology testing, previous records reviewed. Agree with documented assessment and plan. Chest pain without enzyme elevation.  No further in patient evaluation is planned.  HTN:  Norvasc increased.    Gabriel Ramirez  11:48 AM  05/12/2017

## 2017-05-12 NOTE — Clinical Social Work Note (Signed)
Clinical Social Work Assessment  Patient Details  Name: Gabriel Ramirez MRN: [id number] Date of Birth: 1946-09-07  Date of referral:  05/12/17               Reason for consult:  Facility Placement                Permission sought to share information with:  Facility Sport and exercise psychologist, Family Supports Permission granted to share information::  No (patient disoriented)  Name::     Landon "Hayworth" Bell (Landry Corporal (sister) is listed, but she passed away and brother in law available at this number)  Agency::  SNFs  Relationship::  brother-in-law  Contact Information:  [phone]  Housing/Transportation Living arrangements for the past 2 months:  [place] of Information:  Patient, Facility, Other (Comment Required) (brother-in-law) Patient Interpreter Needed:  None Criminal Activity/Legal Involvement Pertinent to Current Situation/Hospitalization:  No - Comment as needed Significant Relationships:  Other Family Members Lives with:  Facility Resident Do you feel safe going back to the place where you live?  Yes Need for family participation in patient care:  Yes (Comment)  Care giving concerns: Patient is from [place]. PT recommending SNF.   Social Worker assessment / plan: CSW met with patient at bedside and spoke to patient's brother-in-law via phone. Patient agreeable to short term nursing stay, though with some confusion and wanted to confirm it will only be short term. CSW confirmed with ILF that patient is resident there. ILF has home health care serices but not SNF. Brother-in-law coming to visit patient and CSW will check in in-person for disposition planning.    Employment status:  Retired Engineer, drilling) PT Recommendations:  [place] / Referral to community resources:  [place]  Patient/Family's Response to care: Patient and family appreciative of  care.  Patient/Family's Understanding of and Emotional Response to Diagnosis, Current Treatment, and Prognosis: Patient disoriented and unaware of the reason for his decline in function. Brother-in-law understanding of patient's condition.  Emotional Assessment Appearance:  Appears stated age Attitude/Demeanor/Rapport:  Other (soft spoke, slow to respond) Affect (typically observed):  Calm Orientation:  Oriented to Self Alcohol / Substance use:  Not Applicable Psych involvement (Current and /or in the community):  No (Comment)  Discharge Needs  Concerns to be addressed:  Discharge Planning Concerns Readmission within the last 30 days:  No Current discharge risk:  Physical Impairment Barriers to Discharge:  Continued Medical Work up   Estanislado Emms, LCSW 05/12/2017, 12:12 PM

## 2017-05-12 NOTE — Progress Notes (Signed)
PROGRESS NOTE    Gabriel Ramirez  NFA:[id number] DOB: December 22, 1945 DOA: 05/10/2017 PCP: Gabriel Ramirez, L.Gabriel Saucer, MD   Outpatient Specialists:     Brief Narrative:  Gabriel Ramirez is a 71 y.o. male with medical history significant of parkinson's disease, CAD s/p stent ~6 years ago, HTN, DM, here for left chest pain.  Patient is a poor historian. Cites a few days of lower left chest pain. Sharp and stabbing, comes and goes. None currently. Provoked by certain movements, not necessarily walking. No associated sob. No cough. Otherwise feeling well. Afebrile. No leg swelling. No recent immobility. No recent med changes. No recent injuries or falls. Unsure what if anything relieves the pain. Denies melena/hematochezia   Assessment & Plan:   Active Problems:   Chest pain   Chest pain - atypical. Heart score 5.  -CE negative -echo: Normal LV systolic function; mild LVH; mild diastolic dysfunction.; mild LAE. -seen by cardiology-- no further inpt eval-- outpatient stress test and follow up with Dr. Jens Ramirez  Anemia -low B12 -replace IM and then PO per patient's request   AKI -  -resolved   DM  - sliding scale insulin  Parkinson's disease - continue home sinimet  HTN - at goal - continue home meds  Social: needs SNF per PT-- social work consult- plan for d/c to SNF when bed available  DVT prophylaxis:  Lovenox   Code Status: DNR   Family Communication:   Disposition Plan:  SNF when bed available   Consultants:   cards    Subjective: Working with OT-- no current complaints  Objective: Vitals:   05/11/17 1958 05/12/17 0434 05/12/17 1013 05/12/17 1345  BP: 136/71 (!) 159/71 (!) 152/78 124/66  Pulse: 70 67  67  Resp: 18 16  18   Temp: 98.2 F (36.8 C) 98 F (36.7 C)  98.8 F (37.1 C)  TempSrc: Oral Oral  Oral  SpO2: 100% 100%  100%  Weight:  85.9 kg (189 lb 4.8 oz)    Height:        Intake/Output Summary (Last 24 hours) at 05/12/17 1444 Last data  filed at 05/12/17 1200  Gross per 24 hour  Intake              870 ml  Output              600 ml  Net              270 ml   Filed Weights   05/11/17 0615 05/12/17 0434  Weight: 86.1 kg (189 lb 14.4 oz) 85.9 kg (189 lb 4.8 oz)    Examination:  General exam: chronically ill appearing- flat affect due to parkinsons Respiratory system: no wheeing Cardiovascular system: rrr Gastrointestinal system: +BS, soft Central nervous system: alert, slow to respond Extremities: no edema     Data Reviewed: I have personally reviewed following labs and imaging studies  CBC:  Recent Labs Lab 05/10/17 1136 05/11/17 0219  WBC 3.2* 3.9*  NEUTROABS 2.1  --   HGB 9.0* 9.2*  HCT 27.1* 27.6*  MCV 108.4* 107.0*  PLT 188 167   Basic Metabolic Panel:  Recent Labs Lab 05/10/17 1136 05/11/17 0219  NA 140 138  K 4.1 3.9  CL 108 107  CO2 25 26  GLUCOSE 118* 106*  BUN 19 16  CREATININE 1.27* 0.85  CALCIUM 8.8* 8.7*   GFR: Estimated Creatinine Clearance: 93.9 mL/min (by C-G formula based on SCr of 0.85 mg/dL). Liver Function Tests:  Recent Labs Lab  05/10/17 1136  AST 16  ALT 15*  ALKPHOS 63  BILITOT 1.3*  PROT 6.4*  ALBUMIN 3.9   No results for input(s): LIPASE, AMYLASE in the last 168 hours. No results for input(s): AMMONIA in the last 168 hours. Coagulation Profile: No results for input(s): INR, PROTIME in the last 168 hours. Cardiac Enzymes:  Recent Labs Lab 05/10/17 1237 05/10/17 1835 05/10/17 2158 05/11/17 0016  TROPONINI <0.03 <0.03 <0.03 <0.03   BNP (last 3 results) No results for input(s): PROBNP in the last 8760 hours. HbA1C:  Recent Labs  05/10/17 1835  HGBA1C 5.7*   CBG:  Recent Labs Lab 05/11/17 1141 05/11/17 1620 05/11/17 1956 05/12/17 0736 05/12/17 1130  GLUCAP 126* 103* 134* 102* 122*   Lipid Profile:  Recent Labs  05/10/17 1835  CHOL 115  HDL 55  LDLCALC 55  TRIG 26  CHOLHDL 2.1   Thyroid Function Tests: No results for  input(s): TSH, T4TOTAL, FREET4, T3FREE, THYROIDAB in the last 72 hours. Anemia Panel:  Recent Labs  05/10/17 1835  VITAMINB12 62*  FOLATE 31.3  FERRITIN 286  TIBC 272  IRON 86   Urine analysis:    Component Value Date/Time   COLORURINE YELLOW 05/10/2017 1438   APPEARANCEUR CLEAR 05/10/2017 1438   LABSPEC 1.029 05/10/2017 1438   PHURINE 6.0 05/10/2017 1438   GLUCOSEU NEGATIVE 05/10/2017 1438   HGBUR NEGATIVE 05/10/2017 1438   BILIRUBINUR NEGATIVE 05/10/2017 1438   KETONESUR NEGATIVE 05/10/2017 1438   PROTEINUR NEGATIVE 05/10/2017 1438   NITRITE NEGATIVE 05/10/2017 1438   LEUKOCYTESUR NEGATIVE 05/10/2017 1438     )No results found for this or any previous visit (from the past 240 hour(s)).    Anti-infectives    None       Radiology Studies: No results found.      Scheduled Meds: . [START ON 05/13/2017] amLODipine  7.5 mg Oral Daily  . aspirin  324 mg Oral Once  . aspirin EC  81 mg Oral Daily  . atorvastatin  20 mg Oral QHS  . carbidopa-levodopa  1 tablet Oral QHS  . cyanocobalamin  1,000 mcg Intramuscular Daily  . enoxaparin (LOVENOX) injection  40 mg Subcutaneous Q24H  . gabapentin  600 mg Oral QHS  . insulin aspart  0-15 Units Subcutaneous TID WC  . quinapril  20 mg Oral QHS   Continuous Infusions:   LOS: 0 days    Time spent: 25 min    Gabriel Godwin U Tatianna Ibbotson, DO [facility] Pager [phone]  If 7PM-7AM, please contact night-coverage [email or web address] Password [email or web address] 05/12/2017, 2:44 PM

## 2017-05-12 NOTE — NC FL2 (Signed)
[place] MEDICAID FL2 LEVEL OF CARE SCREENING TOOL     IDENTIFICATION  Patient Name: Gabriel Ramirez Birthdate: Jun 09, 1946 Sex: male Admission Date (Current Location): 05/10/2017  [facility] and [place] Number:  Producer, television/film/video and Address:  [facility] [employer], [address] [place], [place] [address]      Provider Number: [id number]  Attending Physician Name and Address:  Joseph Art, DO  Relative Name and Phone Number:       Current Level of Care: Hospital Recommended Level of Care: Skilled Nursing Facility Prior Approval Number:    Date Approved/Denied:   PASRR Number: [id number]  Discharge Plan: SNF    Current Diagnoses: Patient Active Problem List   Diagnosis Date Noted  . Chest pain 05/10/2017  . CAD (coronary artery disease) 08/31/2015  . Essential hypertension 08/31/2015  . Hyperlipidemia 08/31/2015  . Parkinson's disease (HCC) 08/31/2015    Orientation RESPIRATION BLADDER Height & Weight     Self  Normal Continent Weight: 189 lb 4.8 oz (85.9 kg) Height:  6\' 3"  (190.5 cm)  BEHAVIORAL SYMPTOMS/MOOD NEUROLOGICAL BOWEL NUTRITION STATUS      Continent Diet (heart; please see DC summary)  AMBULATORY STATUS COMMUNICATION OF NEEDS Skin   Limited Assist Verbally Normal                       Personal Care Assistance Level of Assistance  Bathing, Feeding, Dressing Bathing Assistance: Limited assistance Feeding assistance: Independent Dressing Assistance: Limited assistance     Functional Limitations Info  Sight, Hearing, Speech Sight Info: Impaired (wears glasses) Hearing Info: Adequate Speech Info: Adequate    SPECIAL CARE FACTORS FREQUENCY  PT (By licensed PT), OT (By licensed OT)     PT Frequency: 5x/week OT Frequency: 5x/week            Contractures Contractures Info: Not present    Additional Factors Info  Code Status, Allergies, Insulin Sliding Scale Code Status Info: DNR Allergies Info: No Known  Allergies   Insulin Sliding Scale Info: insulin 3x/day with meals       Current Medications (05/12/2017):  This is the current hospital active medication list Current Facility-Administered Medications  Medication Dose Route Frequency Provider Last Rate Last Dose  . acetaminophen (TYLENOL) tablet 650 mg  650 mg Oral Q4H PRN Wouk, Wilfred Curtis, MD      . Melene Muller ON 05/13/2017] amLODipine (NORVASC) tablet 7.5 mg  7.5 mg Oral Daily Hochrein, Fayrene Fearing, MD      . aspirin chewable tablet 324 mg  324 mg Oral Once Shaune Pollack, MD   Stopped at 05/10/17 1557  . aspirin EC tablet 81 mg  81 mg Oral Daily Kathrynn Running, MD   81 mg at 05/12/17 1013  . atorvastatin (LIPITOR) tablet 20 mg  20 mg Oral QHS Kathrynn Running, MD   20 mg at 05/11/17 2206  . carbidopa-levodopa (SINEMET CR) 50-200 MG per tablet controlled release 1 tablet  1 tablet Oral QHS Wouk, Wilfred Curtis, MD   1 tablet at 05/11/17 2206  . cyanocobalamin ((VITAMIN B-12)) injection 1,000 mcg  1,000 mcg Intramuscular Daily Marlin Canary U, DO   1,000 mcg at 05/12/17 1014  . enoxaparin (LOVENOX) injection 40 mg  40 mg Subcutaneous Q24H Kathrynn Running, MD   40 mg at 05/11/17 1702  . gabapentin (NEURONTIN) capsule 600 mg  600 mg Oral QHS Kathrynn Running, MD   600 mg at 05/11/17 2206  . insulin  aspart (novoLOG) injection 0-15 Units  0-15 Units Subcutaneous TID WC Wouk, Wilfred CurtisNoah Bedford, MD   2 Units at 05/11/17 1145  . ondansetron (ZOFRAN) injection 4 mg  4 mg Intravenous Q6H PRN Wouk, Wilfred CurtisNoah Bedford, MD      . quinapril (ACCUPRIL) tablet 20 mg  20 mg Oral QHS Kathrynn RunningWouk, Noah Bedford, MD   20 mg at 05/11/17 2206     Discharge Medications: Please see discharge summary for a list of discharge medications.  Relevant Imaging Results:  Relevant Lab Results:   Additional Information SSN: [id number]  Abigail ButtsSusan Tamme Mozingo, LCSW

## 2017-05-12 NOTE — Care Management Note (Signed)
Case Management Note  Patient Details  Name: Kathrine HaddockVanderbilt Stallworth MRN: [id number] Date of Birth: 12/16/1945  Subjective/Objective:   Pt presented for Chest Pain- [facility]. Family wants pt to return instead of SNF placement. Pt has Agency called [facility]- providing Aide Services and they assist with medication management. [facility] is onsite for PT/OT. Family is agreeable to services. Family to provide transportation back to facility.   Action/Plan: CM to fax orders to [facility] Facility. [facility] to start care 05-13-17. No further needs from CM at this time.    Expected Discharge Date:                  Expected Discharge Plan:  Home w Home Health Services ([facility] )  In-House Referral:  Clinical Social Work  Discharge planning Services  CM Consult  Post Acute Care Choice:  Home Health Choice offered to:  [facility] POA / Guardian  DME Arranged:  N/A DME Agency:     HH Arranged:  PT, OT HH Agency:  Other - See comment International aid/development worker([facility] with [facility] for Medication Management. )  Status of Service:  Completed, signed off  If discussed at Long Length of Stay Meetings, dates discussed:    Additional Comments:  Gala LewandowskyGraves-Bigelow, Cady Hafen Kaye, RN 05/12/2017, 2:55 PM

## 2017-05-12 NOTE — Progress Notes (Addendum)
CSW met with patient and brother-in-law, Justine Null, at bedside. CSW also spoke to patient's niece, Olivia Mackie, via phone. Patient and family choose for patient to return to [facility] rather than go to SNF. ILF has PT/OT services on-site and also offers programs that provide caregiver assistance. RNCM aware and following for home health needs at [place]. CSW signing off.  Estanislado Emms, [place]

## 2017-05-28 ENCOUNTER — Encounter: Payer: Self-pay | Admitting: Physician Assistant

## 2017-05-28 ENCOUNTER — Ambulatory Visit (INDEPENDENT_AMBULATORY_CARE_PROVIDER_SITE_OTHER): Payer: Federal, State, Local not specified - PPO | Admitting: Physician Assistant

## 2017-05-28 VITALS — BP 123/69 | HR 82 | Ht 75.0 in | Wt 197.0 lb

## 2017-05-28 DIAGNOSIS — I10 Essential (primary) hypertension: Secondary | ICD-10-CM

## 2017-05-28 DIAGNOSIS — I2510 Atherosclerotic heart disease of native coronary artery without angina pectoris: Secondary | ICD-10-CM

## 2017-05-28 DIAGNOSIS — E119 Type 2 diabetes mellitus without complications: Secondary | ICD-10-CM | POA: Diagnosis not present

## 2017-05-28 DIAGNOSIS — E785 Hyperlipidemia, unspecified: Secondary | ICD-10-CM | POA: Diagnosis not present

## 2017-05-28 MED ORDER — NITROGLYCERIN 0.4 MG SL SUBL: 0.4000 mg | SUBLINGUAL_TABLET | SUBLINGUAL | 12 refills | Status: AC | PRN

## 2017-05-28 NOTE — Progress Notes (Signed)
Cardiology Office Note    Date:  05/30/2017   ID:  Gabriel Ramirez, DOB 11/10/1945, MRN [id number]  PCP:  Gabriel Ramirez, Gabriel EwingsL.August Saucerean, MD  Cardiologist:  Dr. Jens Somrenshaw  Chief Complaint  Patient presents with  . Follow-up    post hospital     History of Present Illness:  Gabriel Ramirez is a 71 y.o. male with PMH of CAD s/p PCI of D1 in [place], parkinson's, dementia, DM II, HLD, HTN and chronic anemia. He was most recently admitted on 05/10/2017 with chest pain. On arrival, d-dimer was 1.76, CT of the chest was negative for PE. Serial troponin overnight was negative. Lipid panel showed well-controlled cholesterol. Vitamin B-12 level at 62. Hemoglobin A1c was 5.7. He has chronic anemia with hemoglobin around 9.0-10.0. Echocardiogram obtained on 05/11/2017 showed EF 55-60%, grade 1 DD, atrial septal aneurysm. Norvasc was increased during this admission. He presents today for outpatient chest pain evaluation.  He is actually quite capable of making his own decision. He does not remember the characteristic of his previous angina in [place] 5-6 years ago. He did remember a stress test at the time. He says the last time he had any chest pain was about 2 days after his hospitalization. Since then, he has not had any further chest pain. He is able to ambulate at and near his home without any exertional symptoms such as chest pain or shortness of breath. He does not have any lower extremity edema, orthopnea or PND. We discussed various options including observation versus outpatient stress testing, he wished to hold off on stress testing at this time. I think this is quite reasonable. I will give him PRN sublingual nitroglycerin as needed, he is aware that if he has any recurrence of this chest pain, he will need to take nitroglycerin to give us a call to potentially obtain outpatient Lexiscan Myoview. Otherwise, we will see him again in 2-3 month for reevaluation.  I have urged [name] to discuss his recent  low vitamin B-12 level with his primary care provider which also contributed to his chronic anemia as well.   Past Medical History:  Diagnosis Date  . CAD (coronary artery disease)    Stent in [place]; First diagonal  . Chest pain 04/2017  . Diabetes (HCC)   . Hyperlipidemia   . Hypertension   . Parkinson's disease [facility](HCC)     Past Surgical History:  Procedure Laterality Date  . CORONARY ANGIOPLASTY WITH STENT PLACEMENT      Current Medications: Outpatient Medications Prior to Visit  Medication Sig Dispense Refill  . amLODipine (NORVASC) 2.5 MG tablet Take 3 tablets (7.5 mg total) by mouth daily. 30 tablet 0  . aspirin EC 81 MG tablet Take 81 mg by mouth daily.    Marland Kitchen. atorvastatin (LIPITOR) 20 MG tablet Take 20 mg by mouth at bedtime.   3  . B-12, Methylcobalamin, 1000 MCG SUBL Place 1 tablet under the tongue daily. 30 tablet 0  . Carbidopa-Levodopa ER (SINEMET CR) 25-100 MG tablet controlled release Take 3 tablets by mouth 3 (three) times daily. 270 tablet 2  . gabapentin (NEURONTIN) 300 MG capsule Take 600 mg by mouth at bedtime.    . quinapril (ACCUPRIL) 20 MG tablet Take 20 mg by mouth at bedtime.   3  . sitaGLIPtin (JANUVIA) 50 MG tablet Take 50 mg by mouth daily.    . carbidopa-levodopa (SINEMET CR) 50-200 MG tablet Take 1 tablet by mouth at bedtime. 90 tablet 0  .  Elastic Bandages & Supports (ABDOMINAL BINDER/ELASTIC LARGE) MISC 1 Device by Does not apply route daily. DX: i95.1 1 each 0  . colchicine 0.6 MG tablet Take 0.6 mg by mouth every 8 (eight) hours as needed (for gout flares).     No facility-administered medications prior to visit.      Allergies:   Patient has no known allergies.   Social History   Social History  . Marital status: Single    Spouse name: N/A  . Number of children: N/A  . Years of education: N/A   Social History Main Topics  . Smoking status: Never Smoker  . Smokeless tobacco: Never Used  . Alcohol use No  . Drug use: No  . Sexual  activity: Not Asked   Other Topics Concern  . None   Social History Narrative  . None     Family History:  The patient's family history includes Alzheimer's disease in his mother; Hypertension in his father.   ROS:   Please see the history of present illness.    ROS All other systems reviewed and are negative.   PHYSICAL EXAM:   VS:  BP 123/69   Pulse 82   Ht 6\' 3"  (1.905 m)   Wt 197 lb (89.4 kg)   BMI 24.62 kg/m    GEN: Well nourished, well developed, in no acute distress  HEENT: normal  Neck: no JVD, carotid bruits, or masses Cardiac: RRR; no murmurs, rubs, or gallops,no edema  Respiratory:  clear to auscultation bilaterally, normal work of breathing GI: soft, nontender, nondistended, + BS MS: no deformity or atrophy  Skin: warm and dry, no rash Neuro:  Alert and Oriented x 3, Strength and sensation are intact Psych: euthymic mood, full affect  Wt Readings from Last 3 Encounters:  05/28/17 197 lb (89.4 kg)  05/12/17 189 lb 4.8 oz (85.9 kg)  03/19/17 204 lb (92.5 kg)      Studies/Labs Reviewed:   EKG:  EKG is not ordered today.    Recent Labs: 05/10/2017: ALT 15; B Natriuretic Peptide 19.5 05/11/2017: BUN 16; Creatinine, Ser 0.85; Hemoglobin 9.2; Platelets 167; Potassium 3.9; Sodium 138   Lipid Panel    Component Value Date/Time   CHOL 115 05/10/2017 1835   TRIG 26 05/10/2017 1835   HDL 55 05/10/2017 1835   CHOLHDL 2.1 05/10/2017 1835   VLDL 5 05/10/2017 1835   LDLCALC 55 05/10/2017 1835    Additional studies/ records that were reviewed today include:    CTA of chest 05/10/2017 IMPRESSION: 1. No evidence of pulmonary embolism. 2. No acute findings in the thorax to account for the patient's symptoms. 3. Aortic atherosclerosis, in addition to left main and 3 vessel coronary artery disease. Assessment for potential risk factor modification, dietary therapy or pharmacologic therapy may be warranted, if clinically indicated.   Echo  05/11/2017 ------------------------------------------------------------------- LV EF: 55% -   60%  Study Conclusions  - Left ventricle: The cavity size was normal. Wall thickness was   increased in a pattern of mild LVH. Systolic function was normal.   The estimated ejection fraction was in the range of 55% to 60%.   Wall motion was normal; there were no regional wall motion   abnormalities. Doppler parameters are consistent with abnormal   left ventricular relaxation (grade 1 diastolic dysfunction). - Left atrium: The atrium was mildly dilated. - Atrial septum: There was an atrial septal aneurysm.  Impressions:  - Normal LV systolic function; mild LVH; mild diastolic  dysfunction.; mild LAE.   ASSESSMENT:    1. Coronary artery disease involving native coronary artery of native heart without angina pectoris   2. Essential hypertension   3. Hyperlipidemia, unspecified hyperlipidemia type   4. Controlled type 2 diabetes mellitus without complication, without long-term current use of insulin (HCC)      PLAN:  In order of problems listed above:  1. CAD: He has not had any chest discomfort for the past 2 weeks, we discussed various options including observation versus outpatient stress testing, he wished to hold off on stress testing for now.  2. HTN: Blood pressure stable  3. HLD: On Lipitor 20 one daily  4. DM II: Managed by primary care provider  5. Anemia: During recent admission, it was found that he has very low vitamin B-12 level, he will discuss this with his primary care provider.    Medication Adjustments/Labs and Tests Ordered: Current medicines are reviewed at length with the patient today.  Concerns regarding medicines are outlined above.  Medication changes, Labs and Tests ordered today are listed in the Patient Instructions below. Patient Instructions  Your physician recommends that you schedule a follow-up appointment in: 2-3 MONTHS WITH DR Cathlean Marseilles, PA  05/30/2017 11:58 AM    [facility] [address] [place], [place], [place]  [address] Phone: [phone]; Fax: [phone]

## 2017-05-28 NOTE — Patient Instructions (Signed)
Your physician recommends that you schedule a follow-up appointment in: 2-3 MONTHS WITH DR CRENSHAW  

## 2017-05-30 ENCOUNTER — Encounter: Payer: Self-pay | Admitting: Physician Assistant

## 2017-07-03 ENCOUNTER — Other Ambulatory Visit: Payer: Self-pay | Admitting: Neurology

## 2017-08-11 ENCOUNTER — Other Ambulatory Visit: Payer: Self-pay | Admitting: Neurology

## 2017-08-17 ENCOUNTER — Encounter: Payer: Self-pay | Admitting: Cardiology

## 2017-08-19 NOTE — Progress Notes (Deleted)
HPI: FU CAD. [place] previously lived in [place]. He has had previous PCI of his first diagonal. Full records are not available. Last echocardiogram August 2018 showed normal LV systolic function, grade 1 diastolic dysfunction and mild left atrial enlargement. CTA August 2018 showed no pulmonary embolus. Three-vessel coronary calcification noted. Since last seen  Current Outpatient Medications  Medication Sig Dispense Refill  . amLODipine (NORVASC) 2.5 MG tablet Take 3 tablets (7.5 mg total) by mouth daily. 30 tablet 0  . aspirin EC 81 MG tablet Take 81 mg by mouth daily.    Marland Kitchen. atorvastatin (LIPITOR) 20 MG tablet Take 20 mg by mouth at bedtime.   3  . B-12, Methylcobalamin, 1000 MCG SUBL Place 1 tablet under the tongue daily. 30 tablet 0  . Carbidopa-Levodopa ER (SINEMET CR) 25-100 MG tablet controlled release TAKE 3 TABLETS THREE TIMES DAILY. 270 tablet 0  . Carbidopa-Levodopa ER (SINEMET CR) 25-100 MG tablet controlled release TAKE 3 TABLETS THREE TIMES DAILY. 270 tablet 0  . Elastic Bandages & Supports (ABDOMINAL BINDER/ELASTIC LARGE) MISC 1 Device by Does not apply route daily. DX: i95.1 1 each 0  . gabapentin (NEURONTIN) 300 MG capsule Take 600 mg by mouth at bedtime.    . nitroGLYCERIN (NITROSTAT) 0.4 MG SL tablet Place 1 tablet (0.4 mg total) under the tongue every 5 (five) minutes as needed for chest pain. 25 tablet 12  . quinapril (ACCUPRIL) 20 MG tablet Take 20 mg by mouth at bedtime.   3  . sitaGLIPtin (JANUVIA) 50 MG tablet Take 50 mg by mouth daily.     No current facility-administered medications for this visit.      Past Medical History:  Diagnosis Date  . CAD (coronary artery disease)    Stent in [place]; First diagonal  . Chest pain 04/2017  . Diabetes (HCC)   . Hyperlipidemia   . Hypertension   . Parkinson's disease [facility](HCC)     Past Surgical History:  Procedure Laterality Date  . CORONARY ANGIOPLASTY WITH STENT PLACEMENT      Social History    Socioeconomic History  . Marital status: Single    Spouse name: Not on file  . Number of children: Not on file  . Years of education: Not on file  . Highest education level: Not on file  Social Needs  . Financial resource strain: Not on file  . Food insecurity - worry: Not on file  . Food insecurity - inability: Not on file  . Transportation needs - medical: Not on file  . Transportation needs - non-medical: Not on file  Occupational History  . Not on file  Tobacco Use  . Smoking status: Never Smoker  . Smokeless tobacco: Never Used  Substance and Sexual Activity  . Alcohol use: No    Alcohol/week: 0.0 oz  . Drug use: No  . Sexual activity: Not on file  Other Topics Concern  . Not on file  Social History Narrative  . Not on file    Family History  Problem Relation Age of Onset  . Alzheimer's disease Mother   . Hypertension Father     ROS: no fevers or chills, productive cough, hemoptysis, dysphasia, odynophagia, melena, hematochezia, dysuria, hematuria, rash, seizure activity, orthopnea, PND, pedal edema, claudication. Remaining systems are negative.  Physical Exam: Well-developed well-nourished in no acute distress.  Skin is warm and dry.  HEENT is normal.  Neck is supple.  Chest is clear to auscultation with normal  expansion.  Cardiovascular exam is regular rate and rhythm.  Abdominal exam nontender or distended. No masses palpated. Extremities show no edema. neuro grossly intact  ECG- personally reviewed  A/P  1  Kirk Ruths, MD

## 2017-08-27 ENCOUNTER — Ambulatory Visit: Payer: Medicare Other | Admitting: Cardiology

## 2017-08-28 ENCOUNTER — Other Ambulatory Visit: Payer: Self-pay | Admitting: Neurology

## 2017-09-03 ENCOUNTER — Other Ambulatory Visit: Payer: Self-pay | Admitting: Neurology

## 2017-09-30 ENCOUNTER — Other Ambulatory Visit: Payer: Self-pay | Admitting: Neurology

## 2017-10-01 ENCOUNTER — Telehealth: Payer: Self-pay | Admitting: Neurology

## 2017-10-01 MED ORDER — CARBIDOPA-LEVODOPA ER 25-100 MG PO TBCR: EXTENDED_RELEASE_TABLET | ORAL | 0 refills | Status: DC

## 2017-10-01 MED ORDER — CARBIDOPA-LEVODOPA ER 50-200 MG PO TBCR: 1.0000 | EXTENDED_RELEASE_TABLET | Freq: Every day | ORAL | 0 refills | Status: DC

## 2017-10-01 NOTE — Telephone Encounter (Signed)
Spoke with patient and made him aware he will need to keep appt that he just scheduled for end of January and enough meds sent in to last til appt.

## 2017-10-01 NOTE — Telephone Encounter (Signed)
He called needing a ref[facility] on [facility]opa [facility]opa [facility]. Thanks

## 2017-10-01 NOTE — Telephone Encounter (Signed)
He said he will not have enough medication to last him until his next follow up. He would like you to please call him. Thanks

## 2017-10-02 ENCOUNTER — Ambulatory Visit: Payer: Medicare Other | Admitting: Physician Assistant

## 2017-10-05 ENCOUNTER — Ambulatory Visit (INDEPENDENT_AMBULATORY_CARE_PROVIDER_SITE_OTHER): Payer: Federal, State, Local not specified - PPO | Admitting: Physician Assistant

## 2017-10-05 VITALS — BP 138/90 | HR 79 | Ht 75.0 in | Wt 212.0 lb

## 2017-10-05 DIAGNOSIS — D649 Anemia, unspecified: Secondary | ICD-10-CM | POA: Diagnosis not present

## 2017-10-05 DIAGNOSIS — F0391 Unspecified dementia with behavioral disturbance: Secondary | ICD-10-CM | POA: Diagnosis not present

## 2017-10-05 DIAGNOSIS — E119 Type 2 diabetes mellitus without complications: Secondary | ICD-10-CM

## 2017-10-05 DIAGNOSIS — I2510 Atherosclerotic heart disease of native coronary artery without angina pectoris: Secondary | ICD-10-CM | POA: Diagnosis not present

## 2017-10-05 DIAGNOSIS — I2583 Coronary atherosclerosis due to lipid rich plaque: Secondary | ICD-10-CM | POA: Diagnosis not present

## 2017-10-05 DIAGNOSIS — I10 Essential (primary) hypertension: Secondary | ICD-10-CM | POA: Diagnosis not present

## 2017-10-05 DIAGNOSIS — E785 Hyperlipidemia, unspecified: Secondary | ICD-10-CM

## 2017-10-05 MED ORDER — AMLODIPINE BESYLATE 2.5 MG PO TABS: 5.0000 mg | ORAL_TABLET | Freq: Every day | ORAL | 0 refills | Status: AC

## 2017-10-05 NOTE — Progress Notes (Signed)
Cardiology Office Note    Date:  10/07/2017   ID:  Gabriel Ramirez, DOB 03/07/1946, MRN [id number]  PCP:  Clovis Riley, Elbert Ewings.August Saucer, MD  Cardiologist:  Dr. Jens Som   Chief Complaint  Patient presents with  . Follow-up    seen for Dr. Jens Som, 6 month followup    History of Present Illness:  Gabriel Ramirez is a 72 y.o. male with PMH of CAD s/p PCI of D1 in [place], parkinson's, dementia, DM II, HLD, HTN and chronic anemia. He was most recently admitted on 05/10/2017 with chest pain. On arrival, d-dimer was 1.76, CT of the chest was negative for PE. Serial troponin overnight was negative. Lipid panel showed well-controlled cholesterol. Vitamin B-12 level at 62. Hemoglobin A1c was 5.7. He has chronic anemia with hemoglobin around 9.0-10.0. Echocardiogram obtained on 05/11/2017 showed EF 55-60%, grade 1 DD, atrial septal aneurysm. Norvasc was increased during this admission.  I last saw the patient in August 2018, he did not remember the characteristic of his angina in [place] several years ago.  After discussing various options, we decided to pursue medical therapy and monitoring as outpatient.  He was instructed to contact cardiology if he does have recurrence of chest discomfort.  Patient presents today for cardiology office evaluation.  He denies any chest pain in the past several month.  He has a history of anemia, I did repeat a CBC today.  Otherwise he has no lower extremity edema, orthopnea or PND to suggest heart failure.  He is still walking around at home without significant shortness of breath or chest discomfort.  At this time, there is no further plan of ischemic workup unless symptom recurs.   Past Medical History:  Diagnosis Date  . CAD (coronary artery disease)    Stent in [place]; First diagonal  . Chest pain 04/2017  . Diabetes (HCC)   . Hyperlipidemia   . Hypertension   . Parkinson's disease [facility])     Past Surgical History:  Procedure Laterality Date  . CORONARY  ANGIOPLASTY WITH STENT PLACEMENT      Current Medications: Outpatient Medications Prior to Visit  Medication Sig Dispense Refill  . aspirin EC 81 MG tablet Take 81 mg by mouth daily.    Marland Kitchen atorvastatin (LIPITOR) 20 MG tablet Take 20 mg by mouth at bedtime.   3  . B-12, Methylcobalamin, 1000 MCG SUBL Place 1 tablet under the tongue daily. 30 tablet 0  . carbidopa-levodopa (SINEMET CR) 50-200 MG tablet Take 1 tablet by mouth at bedtime. 30 tablet 0  . Carbidopa-Levodopa ER (SINEMET CR) 25-100 MG tablet controlled release TAKE 3 TABLETS THREE TIMES DAILY. 270 tablet 0  . Elastic Bandages & Supports (ABDOMINAL BINDER/ELASTIC LARGE) MISC 1 Device by Does not apply route daily. DX: i95.1 1 each 0  . gabapentin (NEURONTIN) 300 MG capsule Take 600 mg by mouth at bedtime.    . quinapril (ACCUPRIL) 20 MG tablet Take 20 mg by mouth at bedtime.   3  . sitaGLIPtin (JANUVIA) 50 MG tablet Take 50 mg by mouth daily.    Marland Kitchen amLODipine (NORVASC) 2.5 MG tablet Take 3 tablets (7.5 mg total) by mouth daily. (Patient taking differently: Take 2.5 mg by mouth daily. ) 30 tablet 0  . Carbidopa-Levodopa ER (SINEMET CR) 25-100 MG tablet controlled release TAKE 3 TABLETS THREE TIMES DAILY. 270 tablet 0  . Carbidopa-Levodopa ER (SINEMET CR) 25-100 MG tablet controlled release TAKE 3 TABLETS THREE TIMES DAILY. 270 tablet 0  .  nitroGLYCERIN (NITROSTAT) 0.4 MG SL tablet Place 1 tablet (0.4 mg total) under the tongue every 5 (five) minutes as needed for chest pain. 25 tablet 12   No facility-administered medications prior to visit.      Allergies:   Patient has no known allergies.   Social History   Socioeconomic History  . Marital status: Single    Spouse name: None  . Number of children: None  . Years of education: None  . Highest education level: None  Social Needs  . Financial resource strain: None  . Food insecurity - worry: None  . Food insecurity - inability: None  . Transportation needs - medical: None  .  Transportation needs - non-medical: None  Occupational History  . None  Tobacco Use  . Smoking status: Never Smoker  . Smokeless tobacco: Never Used  Substance and Sexual Activity  . Alcohol use: No    Alcohol/week: 0.0 oz  . Drug use: No  . Sexual activity: None  Other Topics Concern  . None  Social History Narrative  . None     Family History:  The patient's family history includes Alzheimer's disease in his mother; Hypertension in his father.   ROS:   Please see the history of present illness.    ROS All other systems reviewed and are negative.   PHYSICAL EXAM:   VS:  BP 138/90   Pulse 79   Ht 6\' 3"  (1.905 m)   Wt 212 lb (96.2 kg)   BMI 26.50 kg/m    GEN: Well nourished, well developed, in no acute distress   HEENT: normal  Neck: no JVD, carotid bruits, or masses Cardiac: RRR; no murmurs, rubs, or gallops,no edema  Respiratory:  clear to auscultation bilaterally, normal work of breathing GI: soft, nontender, nondistended, + BS MS: no deformity or atrophy  Skin: warm and dry, no rash Neuro:  Alert and Oriented x 3, Strength and sensation are intact Psych: flat affect  Wt Readings from Last 3 Encounters:  10/05/17 212 lb (96.2 kg)  05/28/17 197 lb (89.4 kg)  05/12/17 189 lb 4.8 oz (85.9 kg)      Studies/Labs Reviewed:   EKG:  EKG is ordered today.  The ekg ordered today demonstrates NSR without significant ST-T wave changes  Recent Labs: 05/10/2017: ALT 15; B Natriuretic Peptide 19.5 05/11/2017: BUN 16; Creatinine, Ser 0.85; Potassium 3.9; Sodium 138 10/05/2017: Hemoglobin 12.8; Platelets 187   Lipid Panel    Component Value Date/Time   CHOL 115 05/10/2017 1835   TRIG 26 05/10/2017 1835   HDL 55 05/10/2017 1835   CHOLHDL 2.1 05/10/2017 1835   VLDL 5 05/10/2017 1835   LDLCALC 55 05/10/2017 1835    Additional studies/ records that were reviewed today include:   CTA of chest 05/10/2017 IMPRESSION: 1. No evidence of pulmonary embolism. 2. No acute  findings in the thorax to account for the patient's symptoms. 3. Aortic atherosclerosis, in addition to left main and 3 vessel coronary artery disease. Assessment for potential risk factor modification, dietary therapy or pharmacologic therapy may be warranted, if clinically indicated.   Echo 05/11/2017 ------------------------------------------------------------------- LV EF: 55% - 60%  Study Conclusions  - Left ventricle: The cavity size was normal. Wall thickness was increased in a pattern of mild LVH. Systolic function was normal. The estimated ejection fraction was in the range of 55% to 60%. Wall motion was normal; there were no regional wall motion abnormalities. Doppler parameters are consistent with abnormal left ventricular relaxation (grade  1 diastolic dysfunction). - Left atrium: The atrium was mildly dilated. - Atrial septum: There was an atrial septal aneurysm.  Impressions:  - Normal LV systolic function; mild LVH; mild diastolic dysfunction.; mild LAE.   ASSESSMENT:    1. Coronary artery disease due to lipid rich plaque   2. Chronic anemia   3. Dementia with behavioral disturbance, unspecified dementia type   4. Essential hypertension   5. Hyperlipidemia, unspecified hyperlipidemia type   6. Controlled type 2 diabetes mellitus without complication, without long-term current use of insulin (HCC)      PLAN:  In order of problems listed above:  1. CAD: Denies any chest pain, has been able to ambulate at home without any significant shortness of breath or chest discomfort.  Continue aspirin and statin  2. Chronic anemia: Obtain CBC  3. Hypertension: Blood pressure well controlled on current medication  4. Hyperlipidemia: On Lipitor 20 mg daily.  Last lipid panel obtained in August 2018 showed cholesterol 115, triglyceride 26, HDL 55, LDL 55.  5. DM 2: Managed by primary care provider.  6. Dementia: Still fairly independent despite  baseline dementia.  Somewhat flat affect.    Medication Adjustments/Labs and Tests Ordered: Current medicines are reviewed at length with the patient today.  Concerns regarding medicines are outlined above.  Medication changes, Labs and Tests ordered today are listed in the Patient Instructions below. Patient Instructions  Medication Instructions:  Your physician recommends that you continue on your current medications as directed. Please refer to the Current Medication list given to you today.  Labwork: Your physician recommends that you return for lab work in: [facility]   Testing/Procedures: None   Follow-Up: Your physician wants you to follow-up in: 6 months with Dr Jens Som ONLY. You will receive a reminder letter in the mail two months in advance. If you don't receive a letter, please call our office to schedule the follow-up appointment. CALL THE OFFICE IN December 28, 2017 TO SCHEDULE APPOINTMENT WITH DR CRENSHAW.   Any Other Special Instructions Will Be Listed Below (If Applicable).  If you need a refill on your cardiac medications before your next appointment, please call your pharmacy.     Ramond Dial, [place]  10/07/2017 7:23 AM    [facility] [address] [place], [place], [place]  [address] Phone: [phone]; Fax: [phone]

## 2017-10-05 NOTE — Patient Instructions (Signed)
Medication Instructions:  Your physician recommends that you continue on your current medications as directed. Please refer to the Current Medication list given to you today.  Labwork: Your physician [facility] that [facility]   Testing/Procedures: None   Follow-Up: Your physician wants you to follow-up in: 6 months with Dr Jens Somrenshaw ONLY. You will receive a reminder letter in the mail two months in advance. If you don't receive a letter, please call our office to schedule the follow-up appointment. CALL THE OFFICE IN December 28, 2017 TO SCHEDULE APPOINTMENT WITH DR CRENSHAW.   Any Other Special Instructions Will Be Listed Below (If Applicable).  If you need a refill on your cardiac medications before your next appointment, please call your pharmacy.

## 2017-10-06 LAB — CBC
Hematocrit: 38.5 % (ref 37.5–51.0)
Hemoglobin: 12.8 g/dL — ABNORMAL LOW (ref 13.0–17.7)
MCH: 27.2 pg (ref 26.6–33.0)
MCHC: 33.2 g/dL (ref 31.5–35.7)
MCV: 82 fL (ref 79–97)
Platelets: 187 10*3/uL (ref 150–379)
RBC: 4.7 x10E6/uL (ref 4.14–5.80)
RDW: 14.9 % (ref 12.3–15.4)
WBC: 5.1 10*3/uL (ref 3.4–10.8)

## 2017-10-07 ENCOUNTER — Encounter: Payer: Self-pay | Admitting: Physician Assistant

## 2017-10-07 NOTE — Progress Notes (Signed)
Significant improvement of anemia, continue on current therapy

## 2017-10-22 NOTE — Progress Notes (Signed)
Gabriel Ramirez was seen today in the movement disorders clinic for neurologic consultation at the request of Alroy Dust, L.Marlou Sa, MD.   The patient presents today to discuss his parkinsonism.  No one accompanies him to the visit (family is in the waiting room but he refused to let them back to the appointment).   Limited prior neurology records are available to me and the faxed copy that is available to me is virtually unreadable.  He was previously taken care of in [place].  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  Pt states that he thinks that he was dx in 2012.  He states that his first symptom was that he was "slowing down" and getting "lightheaded."  Over time, he has had some right hand tremor (rarely); he is right hand dominant.  He is on carbidopa/levodopa 25/100, 2 po tid; states that it was increased in May but cannot remember what he was taking before the increase.  He has trouble telling me if the increase helped.  He takes the medication at 9:30 am/4pm/8-9pm (goes to bed about 11 pm).  He admits that he is sleeping a lot during the day.    08/30/15 update:  The patient returns today for follow-up.  He is on carbidopa/levodopa 25/100, 2 tablets 3 times per day (9 AM/1 PM/6 p.m.) and last visit we added carbidopa/levodopa 50/200 at night.  He states that it helped morning stiffness.  I referred him to [facility] [place] for home physical and speech therapy.   The therapy has helped.  Since our last visit, the patient denies any falls.  He denies hallucinations.    I was able to get a copy of his MRI of the brain since our last visit, but it was a very difficult to read fax.  I am unsure if it was dated 12/17/2013 or 12/18/2014.  Regardless, the report stated that it showed chronic microvascular changes and an old lacune in the right frontal region.  I have tried multiple times to get records from [place] and no records were sent.  He  has not been exercising.  He initially reports headache, but upon further questioning he actually has no head pain at all and is trying to tell me that he is having near syncopal episodes after he eats lunch every day.  He tells me that this is the same feeling that he had before he had to have his cardiac stent placed.  He had a cardiologist when he lived in [place], but has not yet established here.  He denies any chest pain.  He states that he does not think that the episodes are associated with taking his noon medication as he had these episodes years ago before he started taking levodopa.  He also c/o constipation.  10/25/15 update:  The patient is following up today.  Last visit, his levodopa was slightly increased.  He is supposed to be taking carbidopa/levodopa 25/100, 2 tablets at 9 AM, noon, 3 PM, and one tablet at 7 PM in addition to carbidopa/levodopa 50/200 at night.  He did not add that one at 7 last visit like he was supposed to.  He doesn't remember [place] talking about it.  He has seen cardiology since our last visit.  I reviewed Dr. Jacalyn Lefevre records.  He is taking a wait and see approach in deciding on whether or not to decrease his blood pressure medications.  The  patient has described some lightheadedness, but he has not had any syncopal episodes.  He is on gabapentin, 600 mg at night for diabetic peripheral neuropathy.  He c/o nausea today.  It doesn't seem to come at any particular time.  He states that it used to be "every once in a while" but now it is more regular.      03/28/16 update:  The patient is following up today.  He is taking carbidopa/levodopa 25/100, 2 tablets at 9 AM, noon, 3 PM, and carbidopa/levodopa 50/200 at night.  A higher dosage has been recommended, but he really has not wanted to go up on the medication.  He is riding stationary bike 2-3 times a week.   He denies any falls since last visit.  The patient has described some lightheadedness, but he has not had any  syncopal episodes.  He is on gabapentin, 600 mg at night for diabetic peripheral neuropathy.  Having headaches, mostly facial.  Coming daily.  Cannot describe quality to me.  May take advil to relieve and does that one time a day.  Has no photophobia and no phonophobia.  Been going on about a month  07/24/16 update:  Pt f/u today.  He is on carbidopa/levodopa 25/100, 2 tablets at 9 AM/noon/3 PM and carbidopa/levodopa 50/200 at bedtime.  Exercising on stationary bike a few days a week.  No falls.  No hallucination.  In ED on 07/08/16 with c/o HA and lightheadedness.  This has been a chronic c/o for him.  Work up was negative. Last visit, I increased his gabapentin to 300 mg in AM and 600 mg in PM to see if that would help with headache.  It doesn't appear that he did this but when we called him to follow up he stated that his headache was better and lightheadedness got better as well. States today that only on gabapentin at night.  However, the lightheadedness came back.  Sometimes he uses the words headaches and dizziness interchangeably but overall he does state that headaches are better and "off balance" is the biggest issue.  The dizziness is all day.    I had him try and hold the levodopa for a few days but he was only able to hold it for 2 days and then stated that the pain over his whole body was so bad that he restarted the medication.  He didn't think perhaps his complaints of lightheadedness/headache were better for the 2 days that he was off of the medication, but has some difficulty telling if this is the case.  10/30/16 update:  The patient follows up today.  I switched him from immediate release levodopa last visit to carbidopa/levodopa 25/100 CR and had him take 3 tablets at 9 AM/3 tablets at noon and 3 tablets at 3 PM.  I asked him to continue the carbidopa/levodopa 50/200 at bedtime.  We did this primarily because of complaints of dizziness.  He went to the emergency room in January because of  chest pain.  While there, he complained about difficulty walking and dizziness.  He did call me and try to get in in January, but no appointments were available (he canceled or missed 3 appointments with me in December).  Pt denies dizziness today and reports that dizziness hasn't been a big issue, despite what had been reported.  States that his problem occurred when a home health aid told him to back down on his carbidopa/levodopa 25/100 CR from 3 po tid to 2 po  tid and he started not feeling well after that.  Reports that he was doing well before that.  States that she was telling him to go up and down on the dosages and he didn't feel well.  He actually stopped it for a short period of time.  Once he got back up on carbidopa/levodopa 25/100 CR 3 po tid he felt much better and denies any real sense of dizziness.  He is exercising faithfully and thinks that has been really helpful.  Works out with trainer 2 days a week and does other exercises himself every other day of the week.  No falls.    03/19/17 update:  Patient is on carbidopa/levodopa 25/100 CR, 3 tablets at 9 AM/3 tablets at noon and 3 tablets at 3 PM.  He is also on carbidopa/levodopa 50/200 at bedtime.  He called me earlier in the month to state that he was having nausea that he associated with levodopa.  I thought that this was strange given that he has been on levodopa for many years but he insisted that it was from the medicine.  He was given a short-term prescription for Zofran.  He states that this helped.  No falls since our last visit.  "I feel pretty good."  Some back pain if sits a while.  Walking for exercise.  PT just ended this week and "it helped a lot."  On gabapentin for sleep and that "makes me drowsy."    10/23/17 update: Patient is seen in follow-up for Parkinson's disease.  I have not seen him since June.  He is on carbidopa/levodopa 25/100 CR, 3 tablets 3 times per day and carbidopa/levodopa 50/200 at bedtime.  He was admitted to  the hospital in August for atypical chest pain.  While in the hospital, he was found to have a low B12.  He wanted to treat this with oral medication although he was given an IM injection in the hospital.  B12 level was only 62.  It was checked after that in august and was in the 500's.  Patient states that he is taking oral B12 daily.  He doesn't know the dosage.  He is exercising - he is doing sit ups.  Was doing PT and it "ran out" 2 weeks ago.   Having some headaches in the AM.  Described as pressure in the frontal region for 30 min after awakening.      Neuroimaging has previously been performed.  It is not available for my review today.  PREVIOUS MEDICATIONS: Sinemet  ALLERGIES:  No Known Allergies  CURRENT MEDICATIONS:  Outpatient Encounter Medications as of 10/23/2017  Medication Sig  . amLODipine (NORVASC) 2.5 MG tablet Take 2 tablets (5 mg total) by mouth daily. (Patient taking differently: Take 2.5 mg by mouth daily. )  . aspirin EC 81 MG tablet Take 81 mg by mouth daily.  Marland Kitchen atorvastatin (LIPITOR) 20 MG tablet Take 20 mg by mouth at bedtime.   . B-12, Methylcobalamin, 1000 MCG SUBL Place 1 tablet under the tongue daily.  . carbidopa-levodopa (SINEMET CR) 50-200 MG tablet Take 1 tablet by mouth at bedtime.  . Carbidopa-Levodopa ER (SINEMET CR) 25-100 MG tablet controlled release TAKE 3 TABLETS THREE TIMES DAILY.  Marland Kitchen gabapentin (NEURONTIN) 300 MG capsule Take 600 mg by mouth at bedtime.  . quinapril (ACCUPRIL) 20 MG tablet Take 10 mg by mouth at bedtime.   . sitaGLIPtin (JANUVIA) 50 MG tablet Take 50 mg by mouth daily.  . nitroGLYCERIN (NITROSTAT) 0.4  MG SL tablet Place 1 tablet (0.4 mg total) under the tongue every 5 (five) minutes as needed for chest pain.  . [DISCONTINUED] Elastic Bandages & Supports (ABDOMINAL BINDER/ELASTIC LARGE) MISC 1 Device by Does not apply route daily. DX: i95.1   No facility-administered encounter medications on file as of 10/23/2017.     PAST MEDICAL  HISTORY:   Past Medical History:  Diagnosis Date  . CAD (coronary artery disease)    Stent in [place]; [place]  . Chest pain 04/2017  . Diabetes ([place])   . Hyperlipidemia   . Hypertension   . Parkinson's disease ([place])     PAST SURGICAL HISTORY:   Past Surgical History:  Procedure Laterality Date  . CORONARY ANGIOPLASTY WITH STENT PLACEMENT      SOCIAL HISTORY:   Social History   Socioeconomic History  . Marital status: Single    Spouse name: Not on file  . Number of children: Not on file  . Years of education: Not on file  . Highest education level: Not on file  Social Needs  . Financial resource strain: Not on file  . Food insecurity - worry: Not on file  . Food insecurity - inability: Not on file  . Transportation needs - medical: Not on file  . Transportation needs - non-medical: Not on file  Occupational History  . Not on file  Tobacco Use  . Smoking status: Never Smoker  . Smokeless tobacco: Never Used  Substance and Sexual Activity  . Alcohol use: No    Alcohol/week: 0.0 oz  . Drug use: No  . Sexual activity: Not on file  Other Topics Concern  . Not on file  Social History Narrative  . Not on file    FAMILY HISTORY:   Family Status  Relation Name Status  . Mother  Deceased       alzheimer's  . Father  Deceased       HTN  . Sister  Deceased       x3  . MGM  Deceased  . MGF  Deceased  . PGM  Deceased  . PGF  Deceased    ROS:  A complete 10 system review of systems was obtained and was unremarkable apart from what is mentioned above.  PHYSICAL EXAMINATION:    VITALS:   Vitals:   10/23/17 1520  BP: 140/80  Pulse: 80  SpO2: 98%  Weight: 208 lb (94.3 kg)  Height: '6\' 3"'  (1.905 m)   No data found.   GEN:  The patient appears stated age and is in NAD. HEENT:  Normocephalic, atraumatic.  The mucous membranes are moist. The superficial temporal arteries are without ropiness or tenderness. CV:  RRR Lungs:  CTAB Neck/HEME:  There are  no carotid bruits bilaterally.  Neurological examination:  Orientation:  Montreal Cognitive Assessment  10/30/2016 06/28/2015  Visuospatial/ Executive (0/5) 0 1  Naming (0/3) 2 2  Attention: Read list of digits (0/2) 2 1  Attention: Read list of letters (0/1) 1 1  Attention: Serial 7 subtraction starting at 100 (0/3) 3 3  Language: Repeat phrase (0/2) 2 2  Language : Fluency (0/1) 1 1  Abstraction (0/2) 2 2  Delayed Recall (0/5) 2 0  Orientation (0/6) 6 5  Total 21 18  Adjusted Score (based on education) 22 19   Cranial nerves: There is good facial symmetry. There is facial hypomimia.  Pupils are equal round and reactive to light bilaterally. Fundoscopic exam reveals clear margins bilaterally.  Extraocular muscles are intact. There is poor smooth pursuit.  The visual fields are full to confrontational testing. The speech is fluent and clear.  He is very hypophonic.   Soft palate rises symmetrically and there is no tongue deviation. Hearing is intact to conversational tone. Sensation: Sensation is intact to light touch throughout. Motor: Strength is 5/5 in the bilateral upper and lower extremities.   Shoulder shrug is equal and symmetric.  There is no pronator drift.   Movement examination: Tone: There is mild increased tone in the L>RUE (some gegenhalten) Abnormal movements: some dyskinesia axially and in the L leg Coordination:  There is decremation with RAM's, with all form of RAMS, including alternating supination and pronation of the forearm, hand opening and closing, finger taps, heel taps and toe taps, right more than left Gait and Station: The patient pushes off of the chair.  He has start hesitation.  He is very short stepped.  He turns en bloc   LABS:  Patient had lab work on 12/04/2016 3 sodium was 142, potassium 4.4, chloride 106, CO2 28, BUN 23, creatinine 1.01 and glucose 120.  AST was 14, ALT 10 and alkaline phosphatase 87.  Patient B12 after IM injection in the hospital  went from 62-543 on May 22, 2017.  On that same date, his white blood cells were 3.7, hemoglobin 10.7, hematocrit 32.7 and platelets 287.  ASSESSMENT/PLAN:  1.  Parkinsonism.  I suspect that this does represent idiopathic Parkinson's disease.  The patient has tremor, bradykinesia, rigidity and postural instability.  -continue carbidopa/levodopa 25/100 CR, 3 tablets at 9 AM/noon/3 PM.    He will continue carbidopa/levodopa 50/200 at bedtime.   -using zofran prn nausea  -would like to see him more involved but difficult given transportation issue.  Met with our social worker today to come up with ideas 2.  Parkinson's disease dementia  -He is living at assisted living.  I would like to see them manage meds but he is doing that for now.  Asked him about how he manages and he states that he is writing everything down when he takes it 3.  Constipation  -This is frequently associated with Parkinson's disease.  Has rancho recipe 4.  Orthostatic hypotension  -he has no sx's currently.   On BP meds (accupril/norvasc) and will need to continue to monitor 5.  Diabetic peripheral neuropathy  -Safety was discussed.  -The patient is on gabapentin, 600 mg at night.  He tells me that this is primarily for sleep.   6.  Headache  -STA without ropiness or tenderness.  No concerning features.   -came back about 3 weeks ago.  If don't go away, we could try to slightly increase gabapentin in the AM but think AM headache likely weather change related.   7.  Severe B12 deficiency  -Level was only 62 while in the hospital in August, 2018.  It was rechecked on 05/22/17 and was 543 (after IM injection).  Will recheck again today since on oral supplementation now.   8.  Follow up is anticipated in the next few months, sooner should new neurologic issues arise.  Much greater than 50% of this visit was spent in counseling and coordinating care.  Total face to face time:  25 min

## 2017-10-23 ENCOUNTER — Other Ambulatory Visit: Payer: Federal, State, Local not specified - PPO

## 2017-10-23 ENCOUNTER — Ambulatory Visit (INDEPENDENT_AMBULATORY_CARE_PROVIDER_SITE_OTHER): Payer: Federal, State, Local not specified - PPO | Admitting: Neurology

## 2017-10-23 ENCOUNTER — Encounter: Payer: Self-pay | Admitting: Psychology

## 2017-10-23 ENCOUNTER — Encounter: Payer: Self-pay | Admitting: Neurology

## 2017-10-23 VITALS — BP 140/80 | HR 80 | Ht 75.0 in | Wt 208.0 lb

## 2017-10-23 DIAGNOSIS — G20 Parkinson's disease: Secondary | ICD-10-CM | POA: Diagnosis not present

## 2017-10-23 DIAGNOSIS — E538 Deficiency of other specified B group vitamins: Secondary | ICD-10-CM

## 2017-10-23 NOTE — Progress Notes (Unsigned)
I met with the patient while he was in the clinic today.  I asked him if he lifted [employer]  and he said he "did not know".  I asked him for the name of the place where he lived and again he said, " I do not know".   We talked a little bit about exercise and resources to be able to do this.  [facility] may be a good resource for the patient.  However, I would like to call [facility] first and talk with them about options that the patient has at their facility.  I am not sure that [facility] would be the most appropriate for him so I would like to investigate first.  I asked him if he had family that lived around locally that helped him and he said that his sister passed away and he did not have family.  However, upon walking him out into the waiting room it was discovered that his brother-in-law Gabriel Ramirez [phone] was the person that brought him to his appointment today.  His brother-in-law escorted him down to the second floor to get his blood work after his appointment.

## 2017-10-23 NOTE — Patient Instructions (Signed)
1. Your provider has requested that you have labwork completed today. Please go to [facility] ([id number]) on the second floor of this building before leaving the office today. You do not need to check in. If you are not called within 15 minutes please check with the front desk.   

## 2017-10-24 LAB — VITAMIN B12: VITAMIN B 12: 719 pg/mL (ref 200–1100)

## 2017-10-28 ENCOUNTER — Other Ambulatory Visit: Payer: Self-pay | Admitting: Neurology

## 2017-11-07 ENCOUNTER — Other Ambulatory Visit: Payer: Self-pay | Admitting: Neurology

## 2018-03-24 NOTE — Progress Notes (Addendum)
Gabriel Ramirez was seen today in the movement disorders clinic for neurologic consultation at the request of Alroy Dust, L.Marlou Sa, MD.   The patient presents today to discuss his parkinsonism.  No one accompanies him to the visit (family is in the waiting room but he refused to let them back to the appointment).   Limited prior neurology records are available to me and the faxed copy that is available to me is virtually unreadable.  He was previously taken care of in [place].  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  Pt states that he thinks that he was dx in 2012.  He states that his first symptom was that he was "slowing down" and getting "lightheaded."  Over time, he has had some right hand tremor (rarely); he is right hand dominant.  He is on carbidopa/levodopa 25/100, 2 po tid; states that it was increased in May but cannot remember what he was taking before the increase.  He has trouble telling me if the increase helped.  He takes the medication at 9:30 am/4pm/8-9pm (goes to bed about 11 pm).  He admits that he is sleeping a lot during the day.    08/30/15 update:  The patient returns today for follow-up.  He is on carbidopa/levodopa 25/100, 2 tablets 3 times per day (9 AM/1 PM/6 p.m.) and last visit we added carbidopa/levodopa 50/200 at night.  He states that it helped morning stiffness.  I referred him to [facility] [place] for home physical and speech therapy.   The therapy has helped.  Since our last visit, the patient denies any falls.  He denies hallucinations.    I was able to get a copy of his MRI of the brain since our last visit, but it was a very difficult to read fax.  I am unsure if it was dated 12/17/2013 or 12/18/2014.  Regardless, the report stated that it showed chronic microvascular changes and an old lacune in the right frontal region.  I have tried multiple times to get records from [place] and no records were sent.  He  has not been exercising.  He initially reports headache, but upon further questioning he actually has no head pain at all and is trying to tell me that he is having near syncopal episodes after he eats lunch every day.  He tells me that this is the same feeling that he had before he had to have his cardiac stent placed.  He had a cardiologist when he lived in [place], but has not yet established here.  He denies any chest pain.  He states that he does not think that the episodes are associated with taking his noon medication as he had these episodes years ago before he started taking levodopa.  He also c/o constipation.  10/25/15 update:  The patient is following up today.  Last visit, his levodopa was slightly increased.  He is supposed to be taking carbidopa/levodopa 25/100, 2 tablets at 9 AM, noon, 3 PM, and one tablet at 7 PM in addition to carbidopa/levodopa 50/200 at night.  He did not add that one at 7 last visit like he was supposed to.  He doesn't remember [place] talking about it.  He has seen cardiology since our last visit.  I reviewed Dr. Jacalyn Lefevre records.  He is taking a wait and see approach in deciding on whether or not to decrease his blood pressure medications.  The  patient has described some lightheadedness, but he has not had any syncopal episodes.  He is on gabapentin, 600 mg at night for diabetic peripheral neuropathy.  He c/o nausea today.  It doesn't seem to come at any particular time.  He states that it used to be "every once in a while" but now it is more regular.      03/28/16 update:  The patient is following up today.  He is taking carbidopa/levodopa 25/100, 2 tablets at 9 AM, noon, 3 PM, and carbidopa/levodopa 50/200 at night.  A higher dosage has been recommended, but he really has not wanted to go up on the medication.  He is riding stationary bike 2-3 times a week.   He denies any falls since last visit.  The patient has described some lightheadedness, but he has not had any  syncopal episodes.  He is on gabapentin, 600 mg at night for diabetic peripheral neuropathy.  Having headaches, mostly facial.  Coming daily.  Cannot describe quality to me.  May take advil to relieve and does that one time a day.  Has no photophobia and no phonophobia.  Been going on about a month  07/24/16 update:  Pt f/u today.  He is on carbidopa/levodopa 25/100, 2 tablets at 9 AM/noon/3 PM and carbidopa/levodopa 50/200 at bedtime.  Exercising on stationary bike a few days a week.  No falls.  No hallucination.  In ED on 07/08/16 with c/o HA and lightheadedness.  This has been a chronic c/o for him.  Work up was negative. Last visit, I increased his gabapentin to 300 mg in AM and 600 mg in PM to see if that would help with headache.  It doesn't appear that he did this but when we called him to follow up he stated that his headache was better and lightheadedness got better as well. States today that only on gabapentin at night.  However, the lightheadedness came back.  Sometimes he uses the words headaches and dizziness interchangeably but overall he does state that headaches are better and "off balance" is the biggest issue.  The dizziness is all day.    I had him try and hold the levodopa for a few days but he was only able to hold it for 2 days and then stated that the pain over his whole body was so bad that he restarted the medication.  He didn't think perhaps his complaints of lightheadedness/headache were better for the 2 days that he was off of the medication, but has some difficulty telling if this is the case.  10/30/16 update:  The patient follows up today.  I switched him from immediate release levodopa last visit to carbidopa/levodopa 25/100 CR and had him take 3 tablets at 9 AM/3 tablets at noon and 3 tablets at 3 PM.  I asked him to continue the carbidopa/levodopa 50/200 at bedtime.  We did this primarily because of complaints of dizziness.  He went to the emergency room in January because of  chest pain.  While there, he complained about difficulty walking and dizziness.  He did call me and try to get in in January, but no appointments were available (he canceled or missed 3 appointments with me in December).  Pt denies dizziness today and reports that dizziness hasn't been a big issue, despite what had been reported.  States that his problem occurred when a home health aid told him to back down on his carbidopa/levodopa 25/100 CR from 3 po tid to 2 po  tid and he started not feeling well after that.  Reports that he was doing well before that.  States that she was telling him to go up and down on the dosages and he didn't feel well.  He actually stopped it for a short period of time.  Once he got back up on carbidopa/levodopa 25/100 CR 3 po tid he felt much better and denies any real sense of dizziness.  He is exercising faithfully and thinks that has been really helpful.  Works out with trainer 2 days a week and does other exercises himself every other day of the week.  No falls.    03/19/17 update:  Patient is on carbidopa/levodopa 25/100 CR, 3 tablets at 9 AM/3 tablets at noon and 3 tablets at 3 PM.  He is also on carbidopa/levodopa 50/200 at bedtime.  He called me earlier in the month to state that he was having nausea that he associated with levodopa.  I thought that this was strange given that he has been on levodopa for many years but he insisted that it was from the medicine.  He was given a short-term prescription for Zofran.  He states that this helped.  No falls since our last visit.  "I feel pretty good."  Some back pain if sits a while.  Walking for exercise.  PT just ended this week and "it helped a lot."  On gabapentin for sleep and that "makes me drowsy."    10/23/17 update: Patient is seen in follow-up for Parkinson's disease.  I have not seen him since June.  He is on carbidopa/levodopa 25/100 CR, 3 tablets 3 times per day and carbidopa/levodopa 50/200 at bedtime.  He was admitted to  the hospital in August for atypical chest pain.  While in the hospital, he was found to have a low B12.  He wanted to treat this with oral medication although he was given an IM injection in the hospital.  B12 level was only 62.  It was checked after that in august and was in the 500's.  Patient states that he is taking oral B12 daily.  He doesn't know the dosage.  He is exercising - he is doing sit ups.  Was doing PT and it "ran out" 2 weeks ago.   Having some headaches in the AM.  Described as pressure in the frontal region for 30 min after awakening.    03/25/28 update: Patient is seen today in follow-up for Parkinson's disease.  The patient is on carbidopa/levodopa 25/100 CR, 3 tablets 3 times per day and carbidopa/levodopa 50/200 at bedtime.  Pt denies falls.  He is riding his bike faithfully every AM.  Pt denies lightheadedness, near syncope.  He will occasionally have a hallucination at bedtime of adults in the room.  They are sometimes are scary.  Happens 1-2 times per week.    Mood has been good.  Recheck of his B12 with oral supplementation was good.  Neuroimaging has previously been performed.  It is not available for my review today.  PREVIOUS MEDICATIONS: Sinemet  ALLERGIES:  No Known Allergies  CURRENT MEDICATIONS:  Outpatient Encounter Medications as of 03/25/2018  Medication Sig  . amLODipine (NORVASC) 2.5 MG tablet Take 2 tablets (5 mg total) by mouth daily. (Patient taking differently: Take 2.5 mg by mouth daily. )  . aspirin EC 81 MG tablet Take 81 mg by mouth daily.  Marland Kitchen atorvastatin (LIPITOR) 20 MG tablet Take 20 mg by mouth at bedtime.   Marland Kitchen  B-12, Methylcobalamin, 1000 MCG SUBL Place 1 tablet under the tongue daily.  . carbidopa-levodopa (SINEMET CR) 50-200 MG tablet TAKE ONE TABLET AT BEDTIME.  . Carbidopa-Levodopa ER (SINEMET CR) 25-100 MG tablet controlled release TAKE 3 TABLETS THREE TIMES DAILY.  Marland Kitchen gabapentin (NEURONTIN) 300 MG capsule Take 600 mg by mouth at bedtime.  .  quinapril (ACCUPRIL) 20 MG tablet Take 10 mg by mouth at bedtime.   . sitaGLIPtin (JANUVIA) 50 MG tablet Take 50 mg by mouth daily.  . nitroGLYCERIN (NITROSTAT) 0.4 MG SL tablet Place 1 tablet (0.4 mg total) under the tongue every 5 (five) minutes as needed for chest pain.  . [DISCONTINUED] Carbidopa-Levodopa ER (SINEMET CR) 25-100 MG tablet controlled release TAKE 3 TABLETS THREE TIMES DAILY.   No facility-administered encounter medications on file as of 03/25/2018.     PAST MEDICAL HISTORY:   Past Medical History:  Diagnosis Date  . CAD (coronary artery disease)    Stent in [place]; First diagonal  . Chest pain 04/2017  . Diabetes (HCC)   . Hyperlipidemia   . Hypertension   . Parkinson's disease (HCC)     PAST SURGICAL HISTORY:   Past Surgical History:  Procedure Laterality Date  . CORONARY ANGIOPLASTY WITH STENT PLACEMENT      SOCIAL HISTORY:   Social History   Socioeconomic History  . Marital status: Single    Spouse name: Not on file  . Number of children: Not on file  . Years of education: Not on file  . Highest education level: Not on file  Occupational History  . Not on file  Social Needs  . Financial resource strain: Not on file  . Food insecurity:    Worry: Not on file    Inability: Not on file  . Transportation needs:    Medical: Not on file    Non-medical: Not on file  Tobacco Use  . Smoking status: Never Smoker  . Smokeless tobacco: Never Used  Substance and Sexual Activity  . Alcohol use: No    Alcohol/week: 0.0 oz  . Drug use: No  . Sexual activity: Not on file  Lifestyle  . Physical activity:    Days per week: Not on file    Minutes per session: Not on file  . Stress: Not on file  Relationships  . Social connections:    Talks on phone: Not on file    Gets together: Not on file    Attends religious service: Not on file    Active member of club or organization: Not on file    Attends meetings of clubs or organizations: Not on file     Relationship status: Not on file  . Intimate partner violence:    Fear of current or ex partner: Not on file    Emotionally abused: Not on file    Physically abused: Not on file    Forced sexual activity: Not on file  Other Topics Concern  . Not on file  Social History Narrative  . Not on file    FAMILY HISTORY:   Family Status  Relation Name Status  . Mother  Deceased       alzheimer's  . Father  Deceased       HTN  . Sister  Deceased       x3  . MGM  Deceased  . MGF  Deceased  . PGM  Deceased  . PGF  Deceased    ROS: Review of Systems  Constitutional: Negative.  HENT: Negative.   Eyes: Negative.   Cardiovascular: Negative.   Gastrointestinal: Negative.   Genitourinary: Negative.   Musculoskeletal: Negative.   Skin: Negative.   Neurological: Positive for headaches.  Psychiatric/Behavioral: Positive for hallucinations.    PHYSICAL EXAMINATION:    VITALS:   Vitals:   03/25/18 1311  BP: 140/74  Pulse: 73  SpO2: 98%  Weight: 206 lb 8 oz (93.7 kg)  Height: 6\' 3"  (1.905 m)   No data found.   GEN:  The patient appears stated age and is in NAD. HEENT:  Normocephalic, atraumatic.  The mucous membranes are moist. The superficial temporal arteries are without ropiness or tenderness. CV:  RRR Lungs:  CTAB Neck/HEME:  There are no carotid bruits bilaterally.  Neurological examination:  Orientation:  Montreal Cognitive Assessment  10/30/2016 06/28/2015  Visuospatial/ Executive (0/5) 0 1  Naming (0/3) 2 2  Attention: Read list of digits (0/2) 2 1  Attention: Read list of letters (0/1) 1 1  Attention: Serial 7 subtraction starting at 100 (0/3) 3 3  Language: Repeat phrase (0/2) 2 2  Language : Fluency (0/1) 1 1  Abstraction (0/2) 2 2  Delayed Recall (0/5) 2 0  Orientation (0/6) 6 5  Total 21 18  Adjusted Score (based on education) 22 19     Cranial nerves: There is good facial symmetry.  He is hypophonic.  The speech is fluent and clear. Soft palate rises  symmetrically and there is no tongue deviation. Hearing is intact to conversational tone. Sensation: Sensation is intact to light touch throughout Motor: Strength is 5/5 in the bilateral upper and lower extremities.   Shoulder shrug is equal and symmetric.  There is no pronator drift.  Movement examination: Tone: There is left greater than right upper extremity increased tone, but there is a significant component of gegenhalten as well. Abnormal movements: No tremor or dyskinesia today. Coordination:  There is  decremation with RAM's, with any form of RAMS, including alternating supination and pronation of the forearm, hand opening and closing, finger taps, heel taps and toe taps bilaterally but it is mild Gait and Station: The patient pushes off of the chair.  He is short stepped.  He turns en bloc.   LABS:   Lab Results  Component Value Date   VITAMINB12 719 10/23/2017   Addendum labs: Lab work was received from the patient's primary care physician dated March 18, 2018.  White blood cells were 3.9, hemoglobin 13.0, hematocrit 40.6 and platelets 200.  Sodium was 142, potassium 3.5, chloride 105, CO2 31, BUN 14 and creatinine 0.91.  Glucose was 110.  TSH 1.98.  ASSESSMENT/PLAN:  1.  Parkinsonism.  I suspect that this does represent idiopathic Parkinson's disease.  The patient has tremor, bradykinesia, rigidity and postural instability.  -continue carbidopa/levodopa 25/100 CR, 3 tablets at 9 AM/noon/3 PM.    He will continue carbidopa/levodopa 50/200 at bedtime.   -using zofran prn nausea 2.  Parkinson's disease dementia with Parkinson hallucinations  -He is living at assisted living.  I would like to see them manage meds but he is doing that for now.  Asked him about how he manages and he states that he is writing everything down when he takes it  -discussed nuplazid.  He doesn't want that.  Will need to continue to watch and address in the future.    -Was going to draw labs today but  phlebotomist was out today.  Call primary care physician and he has an appointment with  them tomorrow and they will draw his labs: chem, UA, cbc, tsh given new onset hallucination 3.  Constipation  -This is frequently associated with Parkinson's disease.  Has rancho recipe 4.  Orthostatic hypotension  -he has no sx's currently.   On BP meds (accupril/norvasc) and will need to continue to monitor 5.  Diabetic peripheral neuropathy  -Safety was discussed.  -The patient is on gabapentin 300 mg - 2 po q hs.  Using primarily for sleep.  Will drop to 1 tablet at nightly as may contribute to hallucinations  6.  Hx of severe B12 deficiency  -Level was only 62 while in the hospital in August, 2018. much improved now on oral supplementation 7.  Follow up is anticipated in the next few months, sooner should new neurologic issues arise.  Much greater than 50% of this visit was spent in counseling and coordinating care.  Total face to face time:  25 min

## 2018-03-25 ENCOUNTER — Ambulatory Visit: Payer: Federal, State, Local not specified - PPO | Admitting: Neurology

## 2018-03-25 ENCOUNTER — Encounter: Payer: Self-pay | Admitting: Neurology

## 2018-03-25 VITALS — BP 140/74 | HR 73 | Ht 75.0 in | Wt 206.5 lb

## 2018-03-25 DIAGNOSIS — Z79899 Other long term (current) drug therapy: Secondary | ICD-10-CM

## 2018-03-25 DIAGNOSIS — R441 Visual hallucinations: Secondary | ICD-10-CM | POA: Diagnosis not present

## 2018-03-25 DIAGNOSIS — G20 Parkinson's disease: Secondary | ICD-10-CM

## 2018-03-25 NOTE — Patient Instructions (Signed)
1.  Decrease gabapentin to 300 mg - 1 tablet at bedtime (from 2 tablets at bedtime) 2. You let me know if you decide you would like to try the medication for hallucinations and we can try it.

## 2018-04-19 ENCOUNTER — Other Ambulatory Visit: Payer: Self-pay | Admitting: Neurology

## 2018-05-06 ENCOUNTER — Other Ambulatory Visit: Payer: Self-pay | Admitting: Neurology

## 2018-06-06 ENCOUNTER — Other Ambulatory Visit: Payer: Self-pay | Admitting: Neurology

## 2018-08-18 NOTE — Progress Notes (Deleted)
Gabriel Ramirez was seen today in the movement disorders clinic for neurologic consultation at the request of Alroy Dust, L.Marlou Sa, MD.   The patient presents today to discuss his parkinsonism.  No one accompanies him to the visit (family is in the waiting room but he refused to let them back to the appointment).   Limited prior neurology records are available to me and the faxed copy that is available to me is virtually unreadable.  He was previously taken care of in [place].  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  Pt states that he thinks that he was dx in 2012.  He states that his first symptom was that he was "slowing down" and getting "lightheaded."  Over time, he has had some right hand tremor (rarely); he is right hand dominant.  He is on carbidopa/levodopa 25/100, 2 po tid; states that it was increased in May but cannot remember what he was taking before the increase.  He has trouble telling me if the increase helped.  He takes the medication at 9:30 am/4pm/8-9pm (goes to bed about 11 pm).  He admits that he is sleeping a lot during the day.    08/30/15 update:  The patient returns today for follow-up.  He is on carbidopa/levodopa 25/100, 2 tablets 3 times per day (9 AM/1 PM/6 p.m.) and last visit we added carbidopa/levodopa 50/200 at night.  He states that it helped morning stiffness.  I referred him to [facility] [place] for home physical and speech therapy.   The therapy has helped.  Since our last visit, the patient denies any falls.  He denies hallucinations.    I was able to get a copy of his MRI of the brain since our last visit, but it was a very difficult to read fax.  I am unsure if it was dated 12/17/2013 or 12/18/2014.  Regardless, the report stated that it showed chronic microvascular changes and an old lacune in the right frontal region.  I have tried multiple times to get records from [place] and no records were sent.  He  has not been exercising.  He initially reports headache, but upon further questioning he actually has no head pain at all and is trying to tell me that he is having near syncopal episodes after he eats lunch every day.  He tells me that this is the same feeling that he had before he had to have his cardiac stent placed.  He had a cardiologist when he lived in [place], but has not yet established here.  He denies any chest pain.  He states that he does not think that the episodes are associated with taking his noon medication as he had these episodes years ago before he started taking levodopa.  He also c/o constipation.  10/25/15 update:  The patient is following up today.  Last visit, his levodopa was slightly increased.  He is supposed to be taking carbidopa/levodopa 25/100, 2 tablets at 9 AM, noon, 3 PM, and one tablet at 7 PM in addition to carbidopa/levodopa 50/200 at night.  He did not add that one at 7 last visit like he was supposed to.  He doesn't remember [place] talking about it.  He has seen cardiology since our last visit.  I reviewed Dr. Jacalyn Lefevre records.  He is taking a wait and see approach in deciding on whether or not to decrease his blood pressure medications.  The  patient has described some lightheadedness, but he has not had any syncopal episodes.  He is on gabapentin, 600 mg at night for diabetic peripheral neuropathy.  He c/o nausea today.  It doesn't seem to come at any particular time.  He states that it used to be "every once in a while" but now it is more regular.      03/28/16 update:  The patient is following up today.  He is taking carbidopa/levodopa 25/100, 2 tablets at 9 AM, noon, 3 PM, and carbidopa/levodopa 50/200 at night.  A higher dosage has been recommended, but he really has not wanted to go up on the medication.  He is riding stationary bike 2-3 times a week.   He denies any falls since last visit.  The patient has described some lightheadedness, but he has not had any  syncopal episodes.  He is on gabapentin, 600 mg at night for diabetic peripheral neuropathy.  Having headaches, mostly facial.  Coming daily.  Cannot describe quality to me.  May take advil to relieve and does that one time a day.  Has no photophobia and no phonophobia.  Been going on about a month  07/24/16 update:  Pt f/u today.  He is on carbidopa/levodopa 25/100, 2 tablets at 9 AM/noon/3 PM and carbidopa/levodopa 50/200 at bedtime.  Exercising on stationary bike a few days a week.  No falls.  No hallucination.  In ED on 07/08/16 with c/o HA and lightheadedness.  This has been a chronic c/o for him.  Work up was negative. Last visit, I increased his gabapentin to 300 mg in AM and 600 mg in PM to see if that would help with headache.  It doesn't appear that he did this but when we called him to follow up he stated that his headache was better and lightheadedness got better as well. States today that only on gabapentin at night.  However, the lightheadedness came back.  Sometimes he uses the words headaches and dizziness interchangeably but overall he does state that headaches are better and "off balance" is the biggest issue.  The dizziness is all day.    I had him try and hold the levodopa for a few days but he was only able to hold it for 2 days and then stated that the pain over his whole body was so bad that he restarted the medication.  He didn't think perhaps his complaints of lightheadedness/headache were better for the 2 days that he was off of the medication, but has some difficulty telling if this is the case.  10/30/16 update:  The patient follows up today.  I switched him from immediate release levodopa last visit to carbidopa/levodopa 25/100 CR and had him take 3 tablets at 9 AM/3 tablets at noon and 3 tablets at 3 PM.  I asked him to continue the carbidopa/levodopa 50/200 at bedtime.  We did this primarily because of complaints of dizziness.  He went to the emergency room in January because of  chest pain.  While there, he complained about difficulty walking and dizziness.  He did call me and try to get in in January, but no appointments were available (he canceled or missed 3 appointments with me in December).  Pt denies dizziness today and reports that dizziness hasn't been a big issue, despite what had been reported.  States that his problem occurred when a home health aid told him to back down on his carbidopa/levodopa 25/100 CR from 3 po tid to 2 po  tid and he started not feeling well after that.  Reports that he was doing well before that.  States that she was telling him to go up and down on the dosages and he didn't feel well.  He actually stopped it for a short period of time.  Once he got back up on carbidopa/levodopa 25/100 CR 3 po tid he felt much better and denies any real sense of dizziness.  He is exercising faithfully and thinks that has been really helpful.  Works out with trainer 2 days a week and does other exercises himself every other day of the week.  No falls.    03/19/17 update:  Patient is on carbidopa/levodopa 25/100 CR, 3 tablets at 9 AM/3 tablets at noon and 3 tablets at 3 PM.  He is also on carbidopa/levodopa 50/200 at bedtime.  He called me earlier in the month to state that he was having nausea that he associated with levodopa.  I thought that this was strange given that he has been on levodopa for many years but he insisted that it was from the medicine.  He was given a short-term prescription for Zofran.  He states that this helped.  No falls since our last visit.  "I feel pretty good."  Some back pain if sits a while.  Walking for exercise.  PT just ended this week and "it helped a lot."  On gabapentin for sleep and that "makes me drowsy."    10/23/17 update: Patient is seen in follow-up for Parkinson's disease.  I have not seen him since June.  He is on carbidopa/levodopa 25/100 CR, 3 tablets 3 times per day and carbidopa/levodopa 50/200 at bedtime.  He was admitted to  the hospital in August for atypical chest pain.  While in the hospital, he was found to have a low B12.  He wanted to treat this with oral medication although he was given an IM injection in the hospital.  B12 level was only 62.  It was checked after that in august and was in the 500's.  Patient states that he is taking oral B12 daily.  He doesn't know the dosage.  He is exercising - he is doing sit ups.  Was doing PT and it "ran out" 2 weeks ago.   Having some headaches in the AM.  Described as pressure in the frontal region for 30 min after awakening.    03/25/28 update: Patient is seen today in follow-up for Parkinson's disease.  The patient is on carbidopa/levodopa 25/100 CR, 3 tablets 3 times per day and carbidopa/levodopa 50/200 at bedtime.  Pt denies falls.  He is riding his bike faithfully every AM.  Pt denies lightheadedness, near syncope.  He will occasionally have a hallucination at bedtime of adults in the room.  They are sometimes are scary.  Happens 1-2 times per week.    Mood has been good.  Recheck of his B12 with oral supplementation was good.  08/19/18 update: Patient seen today in follow-up for Parkinson's disease.  Patient is on carbidopa/levodopa 25/100 CR, 3 tablets 3 times per day in addition to carbidopa/levodopa 50/200 at bedtime.  Gabapentin was reduced last visit to 300 mg, 1 tablet nightly.  He is on that for diabetic neuropathy.  It was reduced because of confusion/hallucinations.  He reports today that ***.  He has had no falls.  His mood has been good.  He remains on B12 supplementation for B12 deficiency.  Neuroimaging has previously been performed.  It is  not available for my review today.  PREVIOUS MEDICATIONS: Sinemet  ALLERGIES:  No Known Allergies  CURRENT MEDICATIONS:  Outpatient Encounter Medications as of 08/19/2018  Medication Sig  . amLODipine (NORVASC) 2.5 MG tablet Take 2 tablets (5 mg total) by mouth daily. (Patient taking differently: Take 2.5 mg by mouth  daily. )  . aspirin EC 81 MG tablet Take 81 mg by mouth daily.  Marland Kitchen atorvastatin (LIPITOR) 20 MG tablet Take 20 mg by mouth at bedtime.   . B-12, Methylcobalamin, 1000 MCG SUBL Place 1 tablet under the tongue daily.  . carbidopa-levodopa (SINEMET CR) 50-200 MG tablet TAKE ONE TABLET AT BEDTIME.  . Carbidopa-Levodopa ER (SINEMET CR) 25-100 MG tablet controlled release TAKE 3 TABLETS THREE TIMES DAILY.  . Carbidopa-Levodopa ER (SINEMET CR) 25-100 MG tablet controlled release TAKE 3 TABLETS THREE TIMES DAILY.  . Carbidopa-Levodopa ER (SINEMET CR) 25-100 MG tablet controlled release Take 3 tablets by mouth 3 (three) times daily.  Marland Kitchen gabapentin (NEURONTIN) 300 MG capsule Take 600 mg by mouth at bedtime.  . nitroGLYCERIN (NITROSTAT) 0.4 MG SL tablet Place 1 tablet (0.4 mg total) under the tongue every 5 (five) minutes as needed for chest pain.  Marland Kitchen quinapril (ACCUPRIL) 20 MG tablet Take 10 mg by mouth at bedtime.   . sitaGLIPtin (JANUVIA) 50 MG tablet Take 50 mg by mouth daily.   No facility-administered encounter medications on file as of 08/19/2018.     PAST MEDICAL HISTORY:   Past Medical History:  Diagnosis Date  . CAD (coronary artery disease)    Stent in [place]; [facility] diagonal  . Chest pain 04/2017  . Diabetes (HCC)   . Hyperlipidemia   . Hypertension   . Parkinson's disease (HCC)     PAST SURGICAL HISTORY:   Past Surgical History:  Procedure Laterality Date  . CORONARY ANGIOPLASTY WITH STENT PLACEMENT      SOCIAL HISTORY:   Social History   Socioeconomic History  . Marital status: Single    Spouse name: Not on file  . Number of children: Not on file  . Years of education: Not on file  . Highest education level: Not on file  Occupational History  . Not on file  Social Needs  . Financial resource strain: Not on file  . Food insecurity:    Worry: Not on file    Inability: Not on file  . Transportation needs:    Medical: Not on file    Non-medical: Not on file  Tobacco  Use  . Smoking status: Never Smoker  . Smokeless tobacco: Never Used  Substance and Sexual Activity  . Alcohol use: No    Alcohol/week: 0.0 standard drinks  . Drug use: No  . Sexual activity: Not on file  Lifestyle  . Physical activity:    Days per week: Not on file    Minutes per session: Not on file  . Stress: Not on file  Relationships  . Social connections:    Talks on phone: Not on file    Gets together: Not on file    Attends religious service: Not on file    Active member of club or organization: Not on file    Attends meetings of clubs or organizations: Not on file    Relationship status: Not on file  . Intimate partner violence:    Fear of current or ex partner: Not on file    Emotionally abused: Not on file    Physically abused: Not on file  Forced sexual activity: Not on file  Other Topics Concern  . Not on file  Social History Narrative  . Not on file    FAMILY HISTORY:   Family Status  Relation Name Status  . Mother  Deceased       alzheimer's  . Father  Deceased       HTN  . Sister  Deceased       x3  . MGM  Deceased  . MGF  Deceased  . PGM  Deceased  . PGF  Deceased    ROS: ROS  PHYSICAL EXAMINATION:    VITALS:   There were no vitals filed for this visit. No data found.   GEN:  The patient appears stated age and is in NAD. HEENT:  Normocephalic, atraumatic.  The mucous membranes are moist. The superficial temporal arteries are without ropiness or tenderness. CV:  RRR Lungs:  CTAB Neck/HEME:  There are no carotid bruits bilaterally.  Neurological examination:  Orientation:  Montreal Cognitive Assessment  10/30/2016 06/28/2015  Visuospatial/ Executive (0/5) 0 1  Naming (0/3) 2 2  Attention: Read list of digits (0/2) 2 1  Attention: Read list of letters (0/1) 1 1  Attention: Serial 7 subtraction starting at 100 (0/3) 3 3  Language: Repeat phrase (0/2) 2 2  Language : Fluency (0/1) 1 1  Abstraction (0/2) 2 2  Delayed Recall (0/5) 2 0   Orientation (0/6) 6 5  Total 21 18  Adjusted Score (based on education) 22 19     Cranial nerves: There is good facial symmetry.  He is hypophonic.  The speech is fluent and clear. Soft palate rises symmetrically and there is no tongue deviation. Hearing is intact to conversational tone. Sensation: Sensation is intact to light touch throughout Motor: Strength is 5/5 in the bilateral upper and lower extremities.   Shoulder shrug is equal and symmetric.  There is no pronator drift.  Movement examination: Tone: There is left greater than right upper extremity increased tone, but there is a significant component of gegenhalten as well. Abnormal movements: No tremor or dyskinesia today. Coordination:  There is  decremation with RAM's, with any form of RAMS, including alternating supination and pronation of the forearm, hand opening and closing, finger taps, heel taps and toe taps bilaterally but it is mild Gait and Station: The patient pushes off of the chair.  He is short stepped.  He turns en bloc.   LABS:   Lab Results  Component Value Date   VITAMINB12 719 10/23/2017   Addendum labs: Lab work was received from the patient's primary care physician dated March 18, 2018.  White blood cells were 3.9, hemoglobin 13.0, hematocrit 40.6 and platelets 200.  Sodium was 142, potassium 3.5, chloride 105, CO2 31, BUN 14 and creatinine 0.91.  Glucose was 110.  TSH 1.98.  ASSESSMENT/PLAN:  1.  Parkinsonism.  I suspect that this does represent idiopathic Parkinson's disease.  The patient has tremor, bradykinesia, rigidity and postural instability.  -continue carbidopa/levodopa 25/100 CR, 3 tablets at 9 AM/noon/3 PM.    He will continue carbidopa/levodopa 50/200 at bedtime.   -using zofran prn nausea 2.  Parkinson's disease dementia with Parkinson hallucinations  -He is living at assisted living.  I would like to see them manage meds but he is doing that for now.  Asked him about how he manages and he  states that he is writing everything down when he takes it  -discussed nuplazid.  He doesn't want that.  Will need to continue to watch and address in the future.   3.  Constipation  -This is frequently associated with Parkinson's disease.  Has rancho recipe 4.  Orthostatic hypotension  -he has no sx's currently.   On BP meds (accupril/norvasc) and will need to continue to monitor 5.  Diabetic peripheral neuropathy  -Safety was discussed.  -The patient is on gabapentin 300 mg - 2 po q hs.  Using primarily for sleep.  Will drop to 1 tablet at nightly as may contribute to hallucinations  6.  Hx of severe B12 deficiency  -Level was only 62 while in the hospital in August, 2018. much improved now on oral supplementation 7.  ***

## 2018-08-19 ENCOUNTER — Ambulatory Visit: Payer: Federal, State, Local not specified - PPO | Admitting: Neurology

## 2018-10-23 ENCOUNTER — Other Ambulatory Visit: Payer: Self-pay | Admitting: Neurology

## 2018-12-06 ENCOUNTER — Other Ambulatory Visit: Payer: Self-pay | Admitting: Neurology

## 2018-12-09 ENCOUNTER — Other Ambulatory Visit: Payer: Self-pay | Admitting: Neurology

## 2018-12-13 ENCOUNTER — Other Ambulatory Visit: Payer: Self-pay | Admitting: Neurology

## 2018-12-13 ENCOUNTER — Telehealth: Payer: Self-pay | Admitting: Neurology

## 2018-12-13 MED ORDER — CARBIDOPA-LEVODOPA ER 25-100 MG PO TBCR: EXTENDED_RELEASE_TABLET | ORAL | 0 refills | Status: DC

## 2018-12-13 NOTE — Telephone Encounter (Signed)
Patient is scheduled for tomorrow 12/14/18 at 1:00 PM. He said he is completely out of his Medication. Thanks

## 2018-12-13 NOTE — Telephone Encounter (Signed)
One month supply sent to pharmacy

## 2018-12-13 NOTE — Telephone Encounter (Signed)
Patient needs to make an appointment.

## 2018-12-13 NOTE — Telephone Encounter (Signed)
Patient called regarding him needing a refill on his Carbidopa Levodopa. He was seen last in 02/2018. He said he uses [facility]. Please Call. Thanks

## 2018-12-14 ENCOUNTER — Other Ambulatory Visit: Payer: Self-pay

## 2018-12-14 ENCOUNTER — Encounter: Payer: Self-pay | Admitting: Neurology

## 2018-12-14 ENCOUNTER — Ambulatory Visit (INDEPENDENT_AMBULATORY_CARE_PROVIDER_SITE_OTHER): Payer: Federal, State, Local not specified - PPO | Admitting: Neurology

## 2018-12-14 ENCOUNTER — Telehealth: Payer: Self-pay | Admitting: Neurology

## 2018-12-14 VITALS — BP 144/86 | HR 80 | Temp 98.0°F | Ht 75.0 in | Wt 212.0 lb

## 2018-12-14 DIAGNOSIS — G20 Parkinson's disease: Secondary | ICD-10-CM

## 2018-12-14 DIAGNOSIS — I951 Orthostatic hypotension: Secondary | ICD-10-CM | POA: Diagnosis not present

## 2018-12-14 DIAGNOSIS — F0280 Dementia in other diseases classified elsewhere without behavioral disturbance: Secondary | ICD-10-CM

## 2018-12-14 MED ORDER — CARBIDOPA-LEVODOPA ER 50-200 MG PO TBCR: 1.0000 | EXTENDED_RELEASE_TABLET | Freq: Every day | ORAL | 1 refills | Status: AC

## 2018-12-14 MED ORDER — CARBIDOPA-LEVODOPA ER 25-100 MG PO TBCR: EXTENDED_RELEASE_TABLET | ORAL | 1 refills | Status: AC

## 2018-12-14 NOTE — Patient Instructions (Addendum)
1.   Change your timing of your carbidopa/levodopa 25/100 CR to 3 tablets at 9 AM/ 3 tablets at noon/and 3 tablets at 3 PM.   2.  Continue taking your carbidopa/levodopa 50/200 CR at bedtime. 3.  Increase your water intake to 60 oz per day

## 2018-12-14 NOTE — Telephone Encounter (Signed)
I called and spoke with Theron Arista at [facility]. He manages Independent living. He is very familiar with patient. He states that he is on his radar but they have minimal concerns about him. When his family had a visit about a year ago they discussed him moving to [place] to be closer to them but he did not want to do that.  He states that patient manages medications well. He always pays rent check on the first himself without issue. Never causes an issue with other residents. He is social with some residents (sits with same lady at lunch every day). Only issue he has is that he will sometimes have hallucinations in the middle of the night and pull his emergency cord. Theron Arista has done some research and realized this was related to Parkinson's and normal so he would check on him and let him know that the people he is seeing aren't real and then the patient is okay. He hasn't had any of these hallucinations in about 2 months.  Patient is still driving sometimes. He drove to the store two days ago (I let Theron Arista know that patient did take a cab to our office today).   Dr. Arbutus Leas Lorain Childes.

## 2018-12-14 NOTE — Progress Notes (Signed)
[facility]riel Ramirez was seen today in the movement disorders clinic for neurologic consultation at the request of Clovis Riley, L.August Saucer, MD.   The patient presents today to discuss his parkinsonism.  No one accompanies him to the visit (family is in the waiting room but he refused to let them back to the appointment).   Limited prior neurology records are available to me and the faxed copy that is available to me is virtually unreadable.  He was previously taken care of in [place].  The diagnosis there was Lewy body Parkinsons disease.  It appears that his symptoms began in approximately 2011.  Pt states that he thinks that he was dx in 2012.  He states that his first symptom was that he was "slowing down" and getting "lightheaded."  Over time, he has had some right hand tremor (rarely); he is right hand dominant.  He is on carbidopa/levodopa 25/100, 2 po tid; states that it was increased in May but cannot remember what he was taking before the increase.  He has trouble telling me if the increase helped.  He takes the medication at 9:30 am/4pm/8-9pm (goes to bed about 11 pm).  He admits that he is sleeping a lot during the day.    08/30/15 update:  The patient returns today for follow-up.  He is on carbidopa/levodopa 25/100, 2 tablets 3 times per day (9 AM/1 PM/6 p.m.) and last visit we added carbidopa/levodopa 50/200 at night.  He states that it helped morning stiffness.  I referred him to [facility] for home physical and speech therapy.   The therapy has helped.  Since our last visit, the patient denies any falls.  He denies hallucinations.    I was able to get a copy of his MRI of the brain since our last visit, but it was a very difficult to read fax.  I am unsure if it was dated 12/17/2013 or 12/18/2014.  Regardless, the report stated that it showed chronic microvascular changes and an old lacune in the right frontal region.  I have tried multiple times to get records from [place] and no records were sent.  He  has not been exercising.  He initially reports headache, but upon further questioning he actually has no head pain at all and is trying to tell me that he is having near syncopal episodes after he eats lunch every day.  He tells me that this is the same feeling that he had before he had to have his cardiac stent placed.  He had a cardiologist when he lived in [place], but has not yet established here.  He denies any chest pain.  He states that he does not think that the episodes are associated with taking his noon medication as he had these episodes years ago before he started taking levodopa.  He also c/o constipation.  10/25/15 update:  The patient is following up today.  Last visit, his levodopa was slightly increased.  He is supposed to be taking carbidopa/levodopa 25/100, 2 tablets at 9 AM, noon, 3 PM, and one tablet at 7 PM in addition to carbidopa/levodopa 50/200 at night.  He did not add that one at 7 last visit like he was supposed to.  He doesn't remember [place] talking about it.  He has seen cardiology since our last visit.  I reviewed Dr. Waunita Schooner records.  He is taking a wait and see approach in deciding on whether or not to decrease his blood pressure medications.  The  patient has described some lightheadedness, but he has not had any syncopal episodes.  He is on gabapentin, 600 mg at night for diabetic peripheral neuropathy.  He c/o nausea today.  It doesn't seem to come at any particular time.  He states that it used to be "every once in a while" but now it is more regular.      03/28/16 update:  The patient is following up today.  He is taking carbidopa/levodopa 25/100, 2 tablets at 9 AM, noon, 3 PM, and carbidopa/levodopa 50/200 at night.  A higher dosage has been recommended, but he really has not wanted to go up on the medication.  He is riding stationary bike 2-3 times a week.   He denies any falls since last visit.  The patient has described some lightheadedness, but he has not had any  syncopal episodes.  He is on gabapentin, 600 mg at night for diabetic peripheral neuropathy.  Having headaches, mostly facial.  Coming daily.  Cannot describe quality to me.  May take advil to relieve and does that one time a day.  Has no photophobia and no phonophobia.  Been going on about a month  07/24/16 update:  Pt f/u today.  He is on carbidopa/levodopa 25/100, 2 tablets at 9 AM/noon/3 PM and carbidopa/levodopa 50/200 at bedtime.  Exercising on stationary bike a few days a week.  No falls.  No hallucination.  In ED on 07/08/16 with c/o HA and lightheadedness.  This has been a chronic c/o for him.  Work up was negative. Last visit, I increased his gabapentin to 300 mg in AM and 600 mg in PM to see if that would help with headache.  It doesn't appear that he did this but when we called him to follow up he stated that his headache was better and lightheadedness got better as well. States today that only on gabapentin at night.  However, the lightheadedness came back.  Sometimes he uses the words headaches and dizziness interchangeably but overall he does state that headaches are better and "off balance" is the biggest issue.  The dizziness is all day.    I had him try and hold the levodopa for a few days but he was only able to hold it for 2 days and then stated that the pain over his whole body was so bad that he restarted the medication.  He didn't think perhaps his complaints of lightheadedness/headache were better for the 2 days that he was off of the medication, but has some difficulty telling if this is the case.  10/30/16 update:  The patient follows up today.  I switched him from immediate release levodopa last visit to carbidopa/levodopa 25/100 CR and had him take 3 tablets at 9 AM/3 tablets at noon and 3 tablets at 3 PM.  I asked him to continue the carbidopa/levodopa 50/200 at bedtime.  We did this primarily because of complaints of dizziness.  He went to the emergency room in January because of  chest pain.  While there, he complained about difficulty walking and dizziness.  He did call me and try to get in in January, but no appointments were available (he canceled or missed 3 appointments with me in December).  Pt denies dizziness today and reports that dizziness hasn't been a big issue, despite what had been reported.  States that his problem occurred when a home health aid told him to back down on his carbidopa/levodopa 25/100 CR from 3 po tid to 2 po  tid and he started not feeling well after that.  Reports that he was doing well before that.  States that she was telling him to go up and down on the dosages and he didn't feel well.  He actually stopped it for a short period of time.  Once he got back up on carbidopa/levodopa 25/100 CR 3 po tid he felt much better and denies any real sense of dizziness.  He is exercising faithfully and thinks that has been really helpful.  Works out with trainer 2 days a week and does other exercises himself every other day of the week.  No falls.    03/19/17 update:  Patient is on carbidopa/levodopa 25/100 CR, 3 tablets at 9 AM/3 tablets at noon and 3 tablets at 3 PM.  He is also on carbidopa/levodopa 50/200 at bedtime.  He called me earlier in the month to state that he was having nausea that he associated with levodopa.  I thought that this was strange given that he has been on levodopa for many years but he insisted that it was from the medicine.  He was given a short-term prescription for Zofran.  He states that this helped.  No falls since our last visit.  "I feel pretty good."  Some back pain if sits a while.  Walking for exercise.  PT just ended this week and "it helped a lot."  On gabapentin for sleep and that "makes me drowsy."    10/23/17 update: Patient is seen in follow-up for Parkinson's disease.  I have not seen him since June.  He is on carbidopa/levodopa 25/100 CR, 3 tablets 3 times per day and carbidopa/levodopa 50/200 at bedtime.  He was admitted to  the hospital in August for atypical chest pain.  While in the hospital, he was found to have a low B12.  He wanted to treat this with oral medication although he was given an IM injection in the hospital.  B12 level was only 62.  It was checked after that in august and was in the 500's.  Patient states that he is taking oral B12 daily.  He doesn't know the dosage.  He is exercising - he is doing sit ups.  Was doing PT and it "ran out" 2 weeks ago.   Having some headaches in the AM.  Described as pressure in the frontal region for 30 min after awakening.    03/25/28 update: Patient is seen today in follow-up for Parkinson's disease.  The patient is on carbidopa/levodopa 25/100 CR, 3 tablets 3 times per day and carbidopa/levodopa 50/200 at bedtime.  Pt denies falls.  He is riding his bike faithfully every AM.  Pt denies lightheadedness, near syncope.  He will occasionally have a hallucination at bedtime of adults in the room.  They are sometimes are scary.  Happens 1-2 times per week.    Mood has been good.  Recheck of his B12 with oral supplementation was good.  12/14/18 update: Patient is seen today in follow-up for Parkinson's disease.  I have not seen him since June due to a prior no show (and showed up 45 min late to appt today).  Patient is supposed to be on carbidopa/levodopa 25/100 CR, 3 tablets at 9 AM/noon/3 PM but is actually taking at 7am (but doesn't get out of bed for the day until 9-10am)/2pm/8pm.  He is on carbidopa/levodopa 50/200 at bedtime (10pm).  No hallucinations during the day.  Has some at night but "while I'm sleeping and sometimes  they wake me up."  C/o headache but upon probing its not headache but dizziness when he gets up and gets better when he sits.    Neuroimaging has previously been performed.  It is not available for my review today.  PREVIOUS MEDICATIONS: Sinemet  ALLERGIES:  No Known Allergies  CURRENT MEDICATIONS:  Outpatient Encounter Medications as of 12/14/2018    Medication Sig   amLODipine (NORVASC) 2.5 MG tablet Take 2 tablets (5 mg total) by mouth daily. (Patient taking differently: Take 2.5 mg by mouth daily. )   aspirin EC 81 MG tablet Take 81 mg by mouth daily.   atorvastatin (LIPITOR) 20 MG tablet Take 20 mg by mouth at bedtime.    B-12, Methylcobalamin, 1000 MCG SUBL Place 1 tablet under the tongue daily.   carbidopa-levodopa (SINEMET CR) 50-200 MG tablet Take 1 tablet by mouth at bedtime.   Carbidopa-Levodopa ER (SINEMET CR) 25-100 MG tablet controlled release 3 tablets at 9 AM/ 3 tablets at noon/and 3 tablets at 3 PM.   gabapentin (NEURONTIN) 300 MG capsule Take 600 mg by mouth at bedtime.   quinapril (ACCUPRIL) 20 MG tablet Take 10 mg by mouth at bedtime.    sitaGLIPtin (JANUVIA) 50 MG tablet Take 50 mg by mouth daily.   [DISCONTINUED] carbidopa-levodopa (SINEMET CR) 50-200 MG tablet TAKE ONE TABLET AT BEDTIME.   [DISCONTINUED] Carbidopa-Levodopa ER (SINEMET CR) 25-100 MG tablet controlled release TAKE 3 TABLETS THREE TIMES DAILY.   nitroGLYCERIN (NITROSTAT) 0.4 MG SL tablet Place 1 tablet (0.4 mg total) under the tongue every 5 (five) minutes as needed for chest pain.   [DISCONTINUED] Carbidopa-Levodopa ER (SINEMET CR) 25-100 MG tablet controlled release Take 3 tablets by mouth 3 (three) times daily.   [DISCONTINUED] Carbidopa-Levodopa ER (SINEMET CR) 25-100 MG tablet controlled release TAKE 3 TABLETS THREE TIMES DAILY.   No facility-administered encounter medications on file as of 12/14/2018.     PAST MEDICAL HISTORY:   Past Medical History:  Diagnosis Date   CAD (coronary artery disease)    [place]; First diagonal   Chest pain 04/2017   Diabetes (HCC)    Hyperlipidemia    Hypertension    Parkinson's disease (HCC)     PAST SURGICAL HISTORY:   Past Surgical History:  Procedure Laterality Date   CORONARY ANGIOPLASTY WITH STENT PLACEMENT      SOCIAL HISTORY:   Social History   Socioeconomic  History   Marital status: Single    Spouse name: Not on file   Number of children: Not on file   Years of education: Not on file   Highest education level: Not on file  Occupational History   Not on file  Social Needs   Financial resource strain: Not on file   Food insecurity:    Worry: Not on file    Inability: Not on file   Transportation needs:    Medical: Not on file    Non-medical: Not on file  Tobacco Use   Smoking status: Never Smoker   Smokeless tobacco: Never Used  Substance and Sexual Activity   Alcohol use: No    Alcohol/week: 0.0 standard drinks   Drug use: No   Sexual activity: Not on file  Lifestyle   Physical activity:    Days per week: Not on file    Minutes per session: Not on file   Stress: Not on file  Relationships   Social connections:    Talks on phone: Not on file  Gets together: Not on file    Attends religious service: Not on file    Active member of club or organization: Not on file    Attends meetings of clubs or organizations: Not on file    Relationship status: Not on file   Intimate partner violence:    Fear of current or ex partner: Not on file    Emotionally abused: Not on file    Physically abused: Not on file    Forced sexual activity: Not on file  Other Topics Concern   Not on file  Social History Narrative   Not on file    FAMILY HISTORY:   Family Status  Relation Name Status   Mother  Deceased       alzheimer's   Father  Deceased       HTN   Sister  Deceased       x3   MGM  Deceased   MGF  Deceased   PGM  Deceased   PGF  Deceased    ROS: Review of Systems  Constitutional: Negative.   HENT: Negative.   Eyes: Negative.   Respiratory: Negative.   Cardiovascular: Negative.   Gastrointestinal: Negative.   Genitourinary: Negative.   Skin: Negative.     PHYSICAL EXAMINATION:    VITALS:   Vitals:   12/14/18 1351  BP: (!) 144/86  Pulse: 80  Temp: 98 F (36.7 C)  TempSrc: Oral    SpO2: 98%  Weight: 212 lb (96.2 kg)  Height:  (1.905 m)   No data found.   GEN:  The patient appears stated age and is in NAD. HEENT:  Normocephalic, atraumatic.  The mucous membranes are moist. The superficial temporal arteries are without ropiness or tenderness. CV:  RRR Lungs:  CTAB Neck/HEME:  There are no carotid bruits bilaterally.  Neurological examination:  Orientation:  Montreal Cognitive Assessment  12/14/2018 10/30/2016 06/28/2015  Visuospatial/ Executive (0/5) 1 0 1  Naming (0/3) Attention: Read list of digits (0/2) Attention: Read list of letters (0/1) Attention: Serial 7 subtraction starting at 100 (0/3) Language: Repeat phrase (0/2) Language : Fluency (0/1) 0 1 1  Abstraction (0/2) Delayed Recall (0/5) 0 2 0  Orientation (0/6) Total Adjusted Score (based on education) Cranial nerves: There is good facial symmetry.  He is hypophonic.  The speech is fluent and clear. Soft palate rises symmetrically and there is no tongue deviation. Hearing is intact to conversational tone. Sensation: Sensation is intact to light touch throughout Motor: Strength is 5/5 in the bilateral upper and lower extremities.   Shoulder shrug is equal and symmetric.  There is no pronator drift.  Movement examination: Tone: There is mild to mod increased tone in the bilateral UE (but hasn't taken med since early AM and seen mid afternoon) Abnormal movements: No tremor or dyskinesia today. Coordination:  There is  decremation with RAM's, with any form of RAMS, including alternating supination and pronation of the forearm, hand opening and closing, finger taps, heel taps and toe taps bilaterally but it is mild Gait and Station: The patient pushes off of the chair.  He is short stepped.  He turns en bloc.   LABS:   Lab Results  Component Value Date   VITAMINB12 719 10/23/2017  labs: Lab work was received from the  patient's primary care physician dated March 18, 2018.  White blood cells were 3.9, hemoglobin 13.0, hematocrit 40.6 and platelets 200.  Sodium was 142, potassium 3.5, chloride 105, CO2 31, BUN 14 and creatinine 0.91.  Glucose was 110.  TSH 1.98.  ASSESSMENT/PLAN:  1.  Parkinsonism.  I suspect that this does represent idiopathic Parkinson's disease.  The patient has tremor, bradykinesia, rigidity and postural instability.  -continue carbidopa/levodopa 25/100 CR, but again told to change the timing of the medication to 3 tablets at 9 AM/noon/3 PM.    He will continue carbidopa/levodopa 50/200 at bedtime.   -using zofran prn nausea 2.  Parkinson's disease dementia with Parkinson hallucinations  -He is living at assisted living but needs higher level of care in my opinion.  No one ever accompanies pt to appts.  Lives independently.  Will contact social work where he lives to see their concerns.  Pt distributing own meds which is a concern I have.  -denies hallucinations today. 3.  Constipation  -This is frequently associated with Parkinson's disease.  Has rancho recipe 4.  Orthostatic hypotension  -he frequently comes in complaining about headache, but when probed, it is not headache, but rather dizziness, likely associated with orthostasis.  He is on 2 different blood pressure medications, but blood pressure remains borderline high today.  Unsure if he is taking his medications properly at home, as he takes them on his own.  Suspect that he gets mild orthostasis when he stands.  Encouraged him to liberalize water intake, which he admits that he really is not doing because of frequency of urination. 5.  Diabetic peripheral neuropathy  -Safety was discussed.  -The patient was on gabapentin 300 mg - 2 po q hs.  Using primarily for sleep. Dropped last visit to 300 mg at night.  Unclear what dose he is taking right now 6.  Hx of severe B12 deficiency  -Level was only 62 while in the hospital in August,  2018. much improved now on oral supplementation 7.  I will plan on seeing the patient back in the next 6 months.  He and I talked about the importance of compliance with visits.

## 2018-12-14 NOTE — Telephone Encounter (Signed)
Will you call [facility] and express my concern for his level of care there and find out if they have concerns for safety (he is distributing own meds).

## 2018-12-14 NOTE — Telephone Encounter (Signed)
Pt should absolutely not be driving.  Please make sure that DMV form filled out for that as concerned about public safety.  Glad he is managing where he is living.  Hope he is managing meds.

## 2018-12-15 NOTE — Telephone Encounter (Signed)
DMV form completed and faxed to [phone] with confirmation received.

## 2019-02-11 ENCOUNTER — Telehealth: Payer: Self-pay | Admitting: Neurology

## 2019-02-11 NOTE — Telephone Encounter (Signed)
Patient called needing to speak with someone regarding his legs and not being able to get up or stand. He said he has been trying to get up from a chair for 45 Minutes. Please Call. Thanks

## 2019-02-14 NOTE — Telephone Encounter (Signed)
Patient brother in law LANDON BELL called back spoke with him in regards to patient he states that he spoke with a employee at [facility] that told him that patient had falling several times with no injury. EMS did go out to assist  him this morning.   Pt c/o of leg stiff and unable to get out of chair or walk due to stiffness in legs.  Pt said that he had a caregiver come out yesterday. Pt requesting more assistance and help with his legs stiffness

## 2019-02-14 NOTE — Telephone Encounter (Signed)
He needs lab work.  He may have to come to the office below to have that done since his facility refuses to draw it.  Its probably better that his PCP see him and do it but I was trying to have the NP at the facility see him for labs.  Anyway, he needs chem, UA with c&s, cbc.  Dx: encephalopathy, PD, med monitor

## 2019-02-14 NOTE — Telephone Encounter (Signed)
Pt lives at [facility].  Someone should be able to assist him.  I've been concerned for quite some time about him living independently there and even called and addressed it with them last time and their social work felt he was safe and good.

## 2019-02-14 NOTE — Telephone Encounter (Signed)
Patient left this message with the answering service on 02-11-19 @ 5:23 pm   Caller woke up this morning and was not able to stand and pull his pants on for 45-50 minutes, is still having trouble standing, states he felt light headed yesterday and pt states no new confusion does not know his phone number    Caller states he is unable to get up out of bed and walk lives by himself and is on the couch now and cannot get up. Does not have family having confusion today    Patient was advise to call 911 and patient states that he will call his niece and then call 911. Caller could not be reached again because he does not know his telephone number.

## 2019-02-14 NOTE — Telephone Encounter (Signed)
Gabriel Ramirez from [place] called in trying to schedule patient for an appt to come into the office. Is this right? Her phone number is [phone]. Thanks!

## 2019-02-14 NOTE — Telephone Encounter (Signed)
Does Gabriel Ramirez have anyone to assist him with video visit?  Also, can they do labs via our orders at [facility]?  If so, get UA with c&S, chem, cbc.  Dx: PD, falls, med monitor

## 2019-02-14 NOTE — Telephone Encounter (Signed)
Called patient Sister Sherrie Mustache no answer left message on her phone that we have sent 911 out to patient home. Also called patient brother Mikey Bussing no answer also left message on phone that patient called with a emergency and 911 was called to patient home. Request that family member also follow ups with patient to see how he is doing.

## 2019-02-14 NOTE — Telephone Encounter (Signed)
Called spoke with patient he understand and will see if he can see one of the NP at the facility for a visit.

## 2019-02-14 NOTE — Telephone Encounter (Signed)
So, he has no family, no transportation, no ability to do an evisit, and won't do labs.  I most certainly can't help then until they can get those things in place.  Unfortunately, I tried to address with the facility before this and they said he was fine to live alone and I disagreed then and this is the result.  Pt may have just a simple UTI that is causing his inability to get up.  Let pt know that perhaps he just needs PCP visit - I think that they have some NP's there at the facility he can visit.

## 2019-02-14 NOTE — Telephone Encounter (Signed)
See below. Please advise.  

## 2019-02-14 NOTE — Telephone Encounter (Signed)
Called patient he does not have a phone with camera to do video visits Also called [facility] spoke with Corrie Dandy and Theron Arista due to patient living in independent living they are not allowed to do labs. They also said that all staff are busy and they would have to find someone that doesn't have a full schedule to assist patient in video visit. They are unable to use their personal devices to help patient with visit. He would have to use his own device which does not have a camera. Pt does want to see you but does not have family nor transportation if he was able to come into the office.

## 2019-02-15 NOTE — Telephone Encounter (Signed)
Called spoke with Shantell the Lobbyist at [facility] she states that they have OT &PT only from [facility]. No on campus physicians. Will contact patient PCP to schedule appt and to have labs drawn. She was given PCP information per chart. Pt was unable to tell her who his PCP was.

## 2019-02-22 ENCOUNTER — Telehealth: Payer: Self-pay | Admitting: Neurology

## 2019-02-22 NOTE — Telephone Encounter (Signed)
Called spoke with patient he states for the past 3 days he has had swelling  in ankles, with back pain. Pt states that he tried to call his PCP yesterday. I informed patient that due to the holiday most office was closed. Please be aware

## 2019-02-22 NOTE — Telephone Encounter (Signed)
Pt aware to contact PCP ?

## 2019-02-22 NOTE — Telephone Encounter (Signed)
Patient left VM about needing to ask medication questions. Please call him back at [phone]. Thanks!

## 2019-02-22 NOTE — Telephone Encounter (Signed)
Agree to call back to PCP as these things are out of my area of expertise.

## 2019-03-06 ENCOUNTER — Encounter (HOSPITAL_COMMUNITY): Payer: Self-pay | Admitting: Emergency Medicine

## 2019-03-06 ENCOUNTER — Emergency Department (HOSPITAL_COMMUNITY): Payer: Federal, State, Local not specified - PPO

## 2019-03-06 ENCOUNTER — Emergency Department (HOSPITAL_COMMUNITY)
Admission: EM | Admit: 2019-03-06 | Discharge: 2019-03-06 | Disposition: A | Discharge status: Home / Self Care | Payer: Federal, State, Local not specified - PPO | Attending: Emergency Medicine | Admitting: Emergency Medicine

## 2019-03-06 ENCOUNTER — Other Ambulatory Visit: Payer: Self-pay

## 2019-03-06 DIAGNOSIS — Y939 Activity, unspecified: Secondary | ICD-10-CM | POA: Insufficient documentation

## 2019-03-06 DIAGNOSIS — G20 Parkinson's disease: Secondary | ICD-10-CM | POA: Diagnosis not present

## 2019-03-06 DIAGNOSIS — Z79899 Other long term (current) drug therapy: Secondary | ICD-10-CM | POA: Diagnosis not present

## 2019-03-06 DIAGNOSIS — I2510 Atherosclerotic heart disease of native coronary artery without angina pectoris: Secondary | ICD-10-CM | POA: Insufficient documentation

## 2019-03-06 DIAGNOSIS — W19XXXA Unspecified fall, initial encounter: Secondary | ICD-10-CM | POA: Insufficient documentation

## 2019-03-06 DIAGNOSIS — G8929 Other chronic pain: Secondary | ICD-10-CM | POA: Diagnosis not present

## 2019-03-06 DIAGNOSIS — Z7982 Long term (current) use of aspirin: Secondary | ICD-10-CM | POA: Insufficient documentation

## 2019-03-06 DIAGNOSIS — Y92009 Unspecified place in unspecified non-institutional (private) residence as the place of occurrence of the external cause: Secondary | ICD-10-CM | POA: Insufficient documentation

## 2019-03-06 DIAGNOSIS — M545 Low back pain: Secondary | ICD-10-CM | POA: Insufficient documentation

## 2019-03-06 DIAGNOSIS — Y999 Unspecified external cause status: Secondary | ICD-10-CM | POA: Insufficient documentation

## 2019-03-06 DIAGNOSIS — E119 Type 2 diabetes mellitus without complications: Secondary | ICD-10-CM | POA: Insufficient documentation

## 2019-03-06 DIAGNOSIS — I10 Essential (primary) hypertension: Secondary | ICD-10-CM | POA: Diagnosis not present

## 2019-03-06 LAB — CBC
HCT: 40.1 % (ref 39.0–52.0)
Hemoglobin: 12.2 g/dL — ABNORMAL LOW (ref 13.0–17.0)
MCH: 27.1 pg (ref 26.0–34.0)
MCHC: 30.4 g/dL (ref 30.0–36.0)
MCV: 88.9 fL (ref 80.0–100.0)
Platelets: 228 10*3/uL (ref 150–400)
RBC: 4.51 MIL/uL (ref 4.22–5.81)
RDW: 12.6 % (ref 11.5–15.5)
WBC: 4.3 10*3/uL (ref 4.0–10.5)
nRBC: 0 % (ref 0.0–0.2)

## 2019-03-06 LAB — BASIC METABOLIC PANEL
Anion gap: 9 (ref 5–15)
BUN: 13 mg/dL (ref 8–23)
CO2: 28 mmol/L (ref 22–32)
Calcium: 9.1 mg/dL (ref 8.9–10.3)
Chloride: 104 mmol/L (ref 98–111)
Creatinine, Ser: 0.99 mg/dL (ref 0.61–1.24)
GFR calc Af Amer: >60 mL/min (ref >60)
GFR calc non Af Amer: >60 mL/min (ref >60)
Glucose, Bld: 112 mg/dL — ABNORMAL HIGH (ref 70–99)
Potassium: 3.4 mmol/L — ABNORMAL LOW (ref 3.5–5.1)
Sodium: 141 mmol/L (ref 135–145)

## 2019-03-06 LAB — CBG MONITORING, ED: Glucose-Capillary: 101 mg/dL — ABNORMAL HIGH (ref 70–99)

## 2019-03-06 LAB — URINALYSIS, ROUTINE W REFLEX MICROSCOPIC
Bilirubin Urine: NEGATIVE
Glucose, UA: NEGATIVE mg/dL
Hgb urine dipstick: NEGATIVE
Ketones, ur: NEGATIVE mg/dL
Leukocytes,Ua: NEGATIVE
Nitrite: NEGATIVE
Protein, ur: NEGATIVE mg/dL
Specific Gravity, Urine: 1.006 (ref 1.005–1.030)
pH: 8 (ref 5.0–8.0)

## 2019-03-06 LAB — CK: Total CK: 109 U/L (ref 49–397)

## 2019-03-06 MED ORDER — SODIUM CHLORIDE 0.9% FLUSH
3.0000 mL | Freq: Once | INTRAVENOUS | Status: AC
  Administered 2019-03-06: 10:00:00 3 mL via INTRAVENOUS

## 2019-03-06 MED ORDER — SODIUM CHLORIDE 0.9 % IV BOLUS
500.0000 mL | Freq: Once | INTRAVENOUS | Status: AC
  Administered 2019-03-06: 500 mL via INTRAVENOUS

## 2019-03-06 NOTE — ED Notes (Signed)
Spoke with pt family

## 2019-03-06 NOTE — ED Notes (Signed)
Cortni Coture spoke with family.

## 2019-03-06 NOTE — ED Notes (Signed)
Patient verbalizes understanding of discharge instructions. Opportunity for questioning and answers were provided. Armband removed by staff, pt discharged from ED.  

## 2019-03-06 NOTE — ED Provider Notes (Signed)
[facility] Provider Note   CSN: [id number] Arrival date & time: 03/06/19  0913    History   Chief Complaint Chief Complaint  Patient presents with   Fall    HPI Gabriel Ramirez is a 73 y.o. male.     HPI   Patient is a 73 year old male with a history of CAD, diabetes, hyperlipidemia, hypertension, Parkinson's disease, who presents to the emergency department today via EMS from [facility], for evaluation after a fall.  Patient states he went to bed in his bed last night and he woke up this morning on the floor next to his chair.  He does not know how he got there or how long he was on the floor.  He does not remember falling.  He was in his normal state of health last night.  He denies any recent fevers, chest pain, shortness of breath, palpitations, cough.  No abdominal pain, nausea vomiting diarrhea or urinary symptoms.  Currently, his only complaint is that he has back pain which he states is chronic and is not worse today.  10:48 AM Spoke with Theron AristaPeter staff from [facility] who states that staff was bringing breakfast to the patient this morning and they found him laying on the ground in his apartment with his shower and sink running.  They have no idea how long he was on the floor.  They state that he has a history of Parkinson's disease and frequently has hallucinations and is confused.  They state that he is currently at his mental baseline. He does not think that the patient is on any blood thinners but he cannot confirm this as the pt is in assisted living and they do not have full access to his medication records. Family is planning to move him back to [place] tomorrow to live with family and take care of him.    Past Medical History:  Diagnosis Date   CAD (coronary artery disease)    [place] [facility]   Chest pain 04/2017   Diabetes (HCC)    Hyperlipidemia    Hypertension     Parkinson's disease [facility](HCC)     Patient Active Problem List   Diagnosis Date Noted   Chest pain 05/10/2017   CAD (coronary artery disease) 08/31/2015   Essential hypertension 08/31/2015   Hyperlipidemia 08/31/2015   Parkinson's disease (HCC) 08/31/2015    Past Surgical History:  Procedure Laterality Date   CORONARY ANGIOPLASTY WITH STENT PLACEMENT          Home Medications    Prior to Admission medications   Medication Sig Start Date End Date Taking? Authorizing Provider  amLODipine (NORVASC) 2.5 MG tablet Take 2 tablets (5 mg total) by mouth daily. Patient taking differently: Take 2.5 mg by mouth daily.  10/05/17   Azalee CourseMeng, Hao, PA  aspirin EC 81 MG tablet Take 81 mg by mouth daily.    [provider]  atorvastatin (LIPITOR) 20 MG tablet Take 20 mg by mouth at bedtime.     [provider]  B-12, Methylcobalamin, 1000 MCG SUBL Place 1 tablet under the tongue daily. 05/12/17   Joseph ArtVann, Jessica U, DO  carbidopa-levodopa (SINEMET CR) 50-200 MG tablet Take 1 tablet by mouth at bedtime. 12/14/18   Tat, Octaviano Battyebecca S, DO  Carbidopa-Levodopa ER (SINEMET CR) 25-100 MG tablet controlled release 3 tablets at 9 AM/ 3 tablets at noon/and 3 tablets at 3 PM. 12/14/18   Tat, Octaviano Battyebecca S,  DO  gabapentin (NEURONTIN) 300 MG capsule Take 600 mg by mouth at bedtime.    [provider]  nitroGLYCERIN (NITROSTAT) 0.4 MG SL tablet Place 1 tablet (0.4 mg total) under the tongue every 5 (five) minutes as needed for chest pain. 05/28/17 08/26/17  Azalee Course, PA  quinapril (ACCUPRIL) 20 MG tablet Take 10 mg by mouth at bedtime.     [provider]  sitaGLIPtin (JANUVIA) 50 MG tablet Take 50 mg by mouth daily.    [provider]    Family History Family History  Problem Relation Age of Onset   Alzheimer's disease Mother    Hypertension Father     Social History Social History   Tobacco Use   Smoking status: Never Smoker   Smokeless tobacco: Never Used    Substance Use Topics   Alcohol use: No    Alcohol/week: 0.0 standard drinks   Drug use: No     Allergies   Patient has no known allergies.   Review of Systems Review of Systems  Constitutional: Negative for fever.  HENT: Negative for ear pain and sore throat.   Eyes: Negative for visual disturbance.  Respiratory: Negative for cough and shortness of breath.   Cardiovascular: Negative for chest pain.  Gastrointestinal: Negative for abdominal pain, constipation, diarrhea, nausea and vomiting.  Genitourinary: Negative for dysuria and hematuria.  Musculoskeletal: Positive for back pain. Negative for neck pain.  Skin: Negative for rash.  Neurological: Negative for dizziness, weakness, light-headedness, numbness and headaches.  All other systems reviewed and are negative.    Physical Exam Updated Vital Signs BP (!) 162/79    Pulse (!) 56    Temp 98.2 F (36.8 C)    Resp 17    Ht  (1.905 m)    Wt 93.9 kg    SpO2 96%    BMI 25.87 kg/m   Physical Exam Vitals signs and nursing note reviewed.  Constitutional:      General: He is not in acute distress.    Appearance: He is well-developed.  HENT:     Head: Normocephalic and atraumatic.     Nose: Nose normal.     Mouth/Throat:     Mouth: Mucous membranes are dry.  Eyes:     Extraocular Movements: Extraocular movements intact.     Conjunctiva/sclera: Conjunctivae normal.     Pupils: Pupils are equal, round, and reactive to light.  Neck:     Musculoskeletal: Neck supple.     Comments: In c-collar Cardiovascular:     Rate and Rhythm: Normal rate and regular rhythm.     Pulses: Normal pulses.     Heart sounds: Murmur present.  Pulmonary:     Effort: Pulmonary effort is normal. No respiratory distress.     Breath sounds: Normal breath sounds.  Abdominal:     General: Bowel sounds are normal.     Palpations: Abdomen is soft.     Tenderness: There is no abdominal tenderness. There is no guarding or rebound.   Musculoskeletal:     Right lower leg: No edema.     Left lower leg: No edema.     Comments: Mild upper thoracic and lower lumbar TTP   Skin:    General: Skin is warm and dry.  Neurological:     Mental Status: He is alert.     Comments: Mental Status:  Alert, thought content appropriate, able to give a coherent history. Speech fluent without evidence of aphasia. Able to follow  2 step commands without difficulty.  Cranial Nerves:  II:  pupils equal, round, reactive to light III,IV, VI: ptosis not present, extra-ocular motions intact bilaterally  V,VII: smile symmetric, facial light touch sensation equal VIII: hearing grossly normal to voice  X: uvula elevates symmetrically  XI: bilateral shoulder shrug symmetric and strong XII: midline tongue extension without fassiculations Motor:  Normal tone. 5/5 strength of BUE and BLE major muscle groups including strong and equal grip strength and dorsiflexion/plantar flexion Sensory: light touch normal in all extremities. Cerebellar: normal finger-to-nose with bilateral upper extremities       ED Treatments / Results  Labs (all labs ordered are listed, but only abnormal results are displayed) Labs Reviewed  BASIC METABOLIC PANEL - Abnormal; Notable for the following components:      Result Value   Potassium 3.4 (*)    Glucose, Bld 112 (*)    All other components within normal limits  CBC - Abnormal; Notable for the following components:   Hemoglobin 12.2 (*)    All other components within normal limits  CBG MONITORING, ED - Abnormal; Notable for the following components:   Glucose-Capillary 101 (*)    All other components within normal limits  CK  URINALYSIS, ROUTINE W REFLEX MICROSCOPIC    EKG EKG Interpretation  Date/Time:  Sunday March 06 2019 09:27:27 EDT Ventricular Rate:  58 PR Interval:    QRS Duration: 104 QT Interval:  441 QTC Calculation: 434 R Axis:   -15 Text Interpretation:  Junctional rhythm Borderline left  axis deviation Artifact in lead(s) I III aVR aVL aVF Confirmed by Virgina NorfolkAdam, [place] [id number]) on 03/06/2019 10:19:10 AM   Radiology Dg Chest 1 View  Result Date: 03/06/2019 CLINICAL DATA:  Fall. EXAM: CHEST  1 VIEW COMPARISON:  05/10/2017 FINDINGS: Lungs are clear. Negative for a pneumothorax. Heart and mediastinum are within normal limits. Tracheal deviation towards the right is unchanged. Bony thorax is unremarkable. IMPRESSION: No acute cardiopulmonary disease. Electronically Signed   By: Richarda OverlieAdam  Henn M.D.   On: 03/06/2019 11:26   Dg Thoracic Spine 2 View  Result Date: 03/06/2019 CLINICAL DATA:  Fall. EXAM: THORACIC SPINE 2 VIEWS COMPARISON:  Cervical spine CT 03/06/2019 and chest CT 05/10/2017 FINDINGS: Kyphosis in the upper thoracic spine. Mild vertebral body height loss in the midthoracic spine is similar to the prior chest CT and likely degenerative in etiology. No evidence for an acute compression deformity. Normal alignment at the thoracolumbar junction. Normal alignment at the cervicothoracic junction although this is better characterized on the recent cervical spine CT. IMPRESSION: No acute abnormality to the thoracic spine. Electronically Signed   By: Richarda OverlieAdam  Henn M.D.   On: 03/06/2019 11:22   Dg Lumbar Spine Complete  Result Date: 03/06/2019 CLINICAL DATA:  Fall and pain. EXAM: LUMBAR SPINE - COMPLETE 4+ VIEW COMPARISON:  Thoracic spine radiographs 03/06/2019 FINDINGS: Mild curvature in the thoracolumbar spine may be related to positioning based on the prior thoracic spine images. Disc space narrowing and endplate changes L5-S1. The other lumbar spine disc levels are maintained. Vertebral body heights are maintained without a compression deformity. IMPRESSION: No acute abnormality in lumbar spine. Degenerative disc disease at L5-S1. Electronically Signed   By: Richarda OverlieAdam  Henn M.D.   On: 03/06/2019 11:24   Ct Head Wo Contrast  Result Date: 03/06/2019 CLINICAL DATA:  Posttraumatic headache.  Found on floor.  EXAM: CT HEAD WITHOUT CONTRAST CT CERVICAL SPINE WITHOUT CONTRAST TECHNIQUE: Multidetector CT imaging of the head and cervical spine was performed  following the standard protocol without intravenous contrast. Multiplanar CT image reconstructions of the cervical spine were also generated. COMPARISON:  None. FINDINGS: CT HEAD FINDINGS Brain: Cerebral atrophy. There is extensive low density throughout the subcortical and periventricular white matter. Patchy low density in the right insular cortex. Negative for acute hemorrhage. Negative for hydronephrosis, midline shift, mass lesion or large acute infarct. Vascular: No hyperdense vessel or unexpected calcification. Skull: Negative for calvarial fracture. There is prominent lucency involving the left maxilla which could be related to odontogenic periapical lucency. Sinuses/Orbits: Visualized paranasal sinuses are well aerated with mild mucosal disease right maxillary sinus. Other: None CT CERVICAL SPINE FINDINGS Alignment: Normal alignment in the cervical spine. Kyphosis in the upper thoracic spine. Skull base and vertebrae: No acute fracture. No primary bone lesion or focal pathologic process. Irregularity along the right side of the superior endplate of C6 is likely degenerative in etiology. No clear evidence for compression fracture at this level. Soft tissues and spinal canal: No prevertebral fluid or swelling. No visible canal hematoma. Disc levels:  Disc spaces are maintained in the cervical spine. Upper chest: Negative. Other: None IMPRESSION: 1. No acute intracranial abnormality. 2. Cerebral atrophy with extensive low density throughout the white matter. Findings are probably associated with chronic ischemic changes. 3. No acute bone abnormality in the cervical spine. 4. Question periapical lucency involving the left maxilla that could be related to underlying dental disease. Electronically Signed   By: Richarda OverlieAdam  Henn M.D.   On: 03/06/2019 10:55   Ct Cervical  Spine Wo Contrast  Result Date: 03/06/2019 CLINICAL DATA:  Posttraumatic headache.  Found on floor. EXAM: CT HEAD WITHOUT CONTRAST CT CERVICAL SPINE WITHOUT CONTRAST TECHNIQUE: Multidetector CT imaging of the head and cervical spine was performed following the standard protocol without intravenous contrast. Multiplanar CT image reconstructions of the cervical spine were also generated. COMPARISON:  None. FINDINGS: CT HEAD FINDINGS Brain: Cerebral atrophy. There is extensive low density throughout the subcortical and periventricular white matter. Patchy low density in the right insular cortex. Negative for acute hemorrhage. Negative for hydronephrosis, midline shift, mass lesion or large acute infarct. Vascular: No hyperdense vessel or unexpected calcification. Skull: Negative for calvarial fracture. There is prominent lucency involving the left maxilla which could be related to odontogenic periapical lucency. Sinuses/Orbits: Visualized paranasal sinuses are well aerated with mild mucosal disease right maxillary sinus. Other: None CT CERVICAL SPINE FINDINGS Alignment: Normal alignment in the cervical spine. Kyphosis in the upper thoracic spine. Skull base and vertebrae: No acute fracture. No primary bone lesion or focal pathologic process. Irregularity along the right side of the superior endplate of C6 is likely degenerative in etiology. No clear evidence for compression fracture at this level. Soft tissues and spinal canal: No prevertebral fluid or swelling. No visible canal hematoma. Disc levels:  Disc spaces are maintained in the cervical spine. Upper chest: Negative. Other: None IMPRESSION: 1. No acute intracranial abnormality. 2. Cerebral atrophy with extensive low density throughout the white matter. Findings are probably associated with chronic ischemic changes. 3. No acute bone abnormality in the cervical spine. 4. Question periapical lucency involving the left maxilla that could be related to underlying  dental disease. Electronically Signed   By: Richarda OverlieAdam  Henn M.D.   On: 03/06/2019 10:55    Procedures Procedures (including critical care time)  Medications Ordered in ED Medications  sodium chloride flush (NS) 0.9 % injection 3 mL (3 mLs Intravenous Given 03/06/19 1001)  sodium chloride 0.9 % bolus 500 mL (500  mLs Intravenous New Bag/Given 03/06/19 1225)     Initial Impression / Assessment and Plan / ED Course  I have reviewed the triage vital signs and the nursing notes.  Pertinent labs & imaging results that were available during my care of the patient were reviewed by me and considered in my medical decision making (see chart for details).     Final Clinical Impressions(s) / ED Diagnoses   Final diagnoses:  Fall, initial encounter  Chronic low back pain, unspecified back pain laterality, unspecified whether sciatica present   73 year old male presenting for evaluation after fall which occurred this morning.  Lives in assisted living.  Unclear how long patient was on the floor.  He does not remember many details surrounding the fall but the facility states he has a history of Parkinson's disease and frequently is confused which could have led to his fall and could explain why he does not remember it.  They believe that the patient is not on any blood thinning medication.  They state that patient's family was actually planning on having him move to [place] tomorrow.  CBG is WNL CBC with mind anemia that appears stable from prior.  BMP with mild hypokalemia. Normal BUN/Cr CK WNL  UA without evidence of UTI   EKG with junctional rhythm, Borderline left axis deviation Artifact in lead(s) I III aVR aVL aVF  CT head negative for acute findings CT cervical spine without acute bony abnormality  CXR with no acute cardiopulmonary process Xray thoracic spine with no acute findings Xray lumbar spine with DDD at L5-S1, no acute findings  Pt able to ambulate steadily with a walker, will give  rx for this.     Durable Medical Equipment  (From admission, onward)         Start     Ordered   03/06/19 1401  For home use only DME Walker  Once    Question:  Patient needs a walker to treat with the following condition  Answer:  Gait instability   03/06/19 1401         Workup reassuring. Suspect fall was mechanical given his underlying parkinson's disease. Doubt syncope or other acute cause of fall that would require further w/u or admission. Discussed findings with pt and family. He is stable for discharge. Advised to return to the ED for new or worsening sxs. Pt voices understanding and is in agreement with plan. All questions answered.   ED Discharge Orders    None       Rodney Booze, PA-C 03/06/19 1523    Lennice Sites, DO 03/06/19 1556

## 2019-03-06 NOTE — ED Notes (Signed)
Bolus started. Plan to get urine via condom cath. Plan to ambulate pt when I have a EMT available to assist.

## 2019-03-06 NOTE — ED Notes (Signed)
Brother picked up walker.

## 2019-03-06 NOTE — Discharge Instructions (Addendum)
Please follow up with your primary care provider within 5-7 days for re-evaluation of your symptoms. If you do not have a primary care provider, information for a healthcare clinic has been provided for you to make arrangements for follow up care. Please return to the emergency department for any new or worsening symptoms. ° °

## 2019-03-06 NOTE — ED Notes (Signed)
Ambulated pt with [place] PA

## 2019-03-06 NOTE — ED Notes (Signed)
Placed condom cath on pt 

## 2019-03-06 NOTE — ED Triage Notes (Signed)
Arrived from an assisted living ([facility] via [facility] with reports that he was found of the floor by staff this morning. Nobody was able to tell when he was seen last or explain how he got to the floor.  Patient is A&O X3- he knows his name and DOB, he was not sure why he is at the hospital but able to tell this RN that he was at the hospital in [place] and he lives in a "senior living,"  He reports feeling "sore" on his back, rated 4 on a 0-10 scale, but also said he "I'm always sore."

## 2019-03-06 NOTE — ED Notes (Signed)
[place] [phone]. He agrees to pick up the walker at the ED. He prefers to get it at the time Corey Harold comes so that he can take it to the facility.

## 2019-03-06 NOTE — ED Notes (Signed)
Urine has not been collected, clicked off by mistake.

## 2019-03-06 NOTE — Care Management (Signed)
ED CM received consult from [place] PA-C on [place] for rolling walker for home use. CM confirmed order contacted [place] [phone] at [facility], rolling walker will be delivered to room prior to discharge home today.  No further ED CM needs identified.  Laurena Slimmer RN, BSN  ED Care Manager [phone]

## 2019-03-06 NOTE — ED Notes (Signed)
Pt transported to CT ?

## 2019-03-06 NOTE — ED Notes (Signed)
Called [name], pt brother to let him know Gabriel Ramirez has been called and may come at any time. Also let him know that the walker can be picked up here at the ED anytime.

## 2019-03-06 NOTE — ED Provider Notes (Signed)
Medical screening examination/treatment/procedure(s) were conducted as a shared visit with non-physician practitioner(s) and myself.  I personally evaluated the patient during the encounter. Briefly, the patient is a 73 y.o. male with history of Parkinson's disease who presents to the ED after unwitnessed fall.  Patient lives at skilled nursing facility.  Patient was found next to a chair where he was sleeping.  Does not remember fall.  Did not have any pain.  Has multiple falls due to Parkinson's history.  Ambulates with a cane.  CT scan of his head unremarkable.  Chest x-ray, thoracic and lumbar x-ray is unremarkable.  CTA of the neck unremarkable.  EKG shows sinus rhythm.  No significant electrolyte abnormality, kidney injury, leukocytosis.  CK normal.  Overall suspect that he had a fall due to poor ambulation at baseline.  He has some dementia at baseline as well.  Overall no acute findings.  Family has been discussing getting more intense physical therapy.  They were made aware of today's findings.  Patient stable for discharge.  This chart was dictated using voice recognition software.  Despite best efforts to proofread,  errors can occur which can change the documentation meaning.     EKG Interpretation  Date/Time:  Sunday March 06 2019 09:27:27 EDT Ventricular Rate:  58 PR Interval:    QRS Duration: 104 QT Interval:  441 QTC Calculation: 434 R Axis:   -15 Text Interpretation:  Junctional rhythm Borderline left axis deviation Artifact in lead(s) I III aVR aVL aVF Confirmed by Lennice Sites [id number] on 03/06/2019 10:19:10 AM           Lennice Sites, DO 03/06/19 1344

## 2019-06-21 ENCOUNTER — Ambulatory Visit: Payer: Federal, State, Local not specified - PPO | Admitting: Neurology

## 2019-06-21 NOTE — Progress Notes (Deleted)
Gabriel Ramirez was seen today in the movement disorders clinic for neurologic consultation at the request of Clovis Riley, L.August Saucer, MD.   The patient presents today to discuss his parkinsonism.  No one accompanies him to the visit (family is in the waiting room but he refused to let them back to the appointment).   Limited prior neurology records are available to me and the faxed copy that is available to me is virtually unreadable.  He was previously taken care of in [place].  The diagnosis there was Lewy body Parkinsons disease.  It appears that his symptoms began in approximately 2011.  Pt states that he thinks that he was dx in 2012.  He states that his first symptom was that he was "slowing down" and getting "lightheaded."  Over time, he has had some right hand tremor (rarely); he is right hand dominant.  He is on carbidopa/levodopa 25/100, 2 po tid; states that it was increased in May but cannot remember what he was taking before the increase.  He has trouble telling me if the increase helped.  He takes the medication at 9:30 am/4pm/8-9pm (goes to bed about 11 pm).  He admits that he is sleeping a lot during the day.    08/30/15 update:  The patient returns today for follow-up.  He is on carbidopa/levodopa 25/100, 2 tablets 3 times per day (9 AM/1 PM/6 p.m.) and last visit we added carbidopa/levodopa 50/200 at night.  He states that it helped morning stiffness.  I referred him to [facility] for home physical and speech therapy.   The therapy has helped.  Since our last visit, the patient denies any falls.  He denies hallucinations.    I was able to get a copy of his MRI of the brain since our last visit, but it was a very difficult to read fax.  I am unsure if it was dated 12/17/2013 or 12/18/2014.  Regardless, the report stated that it showed chronic microvascular changes and an old lacune in the right frontal region.  I have tried multiple times to get records from [place] and no records were sent.  He  has not been exercising.  He initially reports headache, but upon further questioning he actually has no head pain at all and is trying to tell me that he is having near syncopal episodes after he eats lunch every day.  He tells me that this is the same feeling that he had before he had to have his cardiac stent placed.  He had a cardiologist when he lived in [place], but has not yet established here.  He denies any chest pain.  He states that he does not think that the episodes are associated with taking his noon medication as he had these episodes years ago before he started taking levodopa.  He also c/o constipation.  10/25/15 update:  The patient is following up today.  Last visit, his levodopa was slightly increased.  He is supposed to be taking carbidopa/levodopa 25/100, 2 tablets at 9 AM, noon, 3 PM, and one tablet at 7 PM in addition to carbidopa/levodopa 50/200 at night.  He did not add that one at 7 last visit like he was supposed to.  He doesn't remember [place] talking about it.  He has seen cardiology since our last visit.  I reviewed Dr. Waunita Schooner records.  He is taking a wait and see approach in deciding on whether or not to decrease his blood pressure medications.  The  patient has described some lightheadedness, but he has not had any syncopal episodes.  He is on gabapentin, 600 mg at night for diabetic peripheral neuropathy.  He c/o nausea today.  It doesn't seem to come at any particular time.  He states that it used to be "every once in a while" but now it is more regular.      03/28/16 update:  The patient is following up today.  He is taking carbidopa/levodopa 25/100, 2 tablets at 9 AM, noon, 3 PM, and carbidopa/levodopa 50/200 at night.  A higher dosage has been recommended, but he really has not wanted to go up on the medication.  He is riding stationary bike 2-3 times a week.   He denies any falls since last visit.  The patient has described some lightheadedness, but he has not had any  syncopal episodes.  He is on gabapentin, 600 mg at night for diabetic peripheral neuropathy.  Having headaches, mostly facial.  Coming daily.  Cannot describe quality to me.  May take advil to relieve and does that one time a day.  Has no photophobia and no phonophobia.  Been going on about a month  07/24/16 update:  Pt f/u today.  He is on carbidopa/levodopa 25/100, 2 tablets at 9 AM/noon/3 PM and carbidopa/levodopa 50/200 at bedtime.  Exercising on stationary bike a few days a week.  No falls.  No hallucination.  In ED on 07/08/16 with c/o HA and lightheadedness.  This has been a chronic c/o for him.  Work up was negative. Last visit, I increased his gabapentin to 300 mg in AM and 600 mg in PM to see if that would help with headache.  It doesn't appear that he did this but when we called him to follow up he stated that his headache was better and lightheadedness got better as well. States today that only on gabapentin at night.  However, the lightheadedness came back.  Sometimes he uses the words headaches and dizziness interchangeably but overall he does state that headaches are better and "off balance" is the biggest issue.  The dizziness is all day.    I had him try and hold the levodopa for a few days but he was only able to hold it for 2 days and then stated that the pain over his whole body was so bad that he restarted the medication.  He didn't think perhaps his complaints of lightheadedness/headache were better for the 2 days that he was off of the medication, but has some difficulty telling if this is the case.  10/30/16 update:  The patient follows up today.  I switched him from immediate release levodopa last visit to carbidopa/levodopa 25/100 CR and had him take 3 tablets at 9 AM/3 tablets at noon and 3 tablets at 3 PM.  I asked him to continue the carbidopa/levodopa 50/200 at bedtime.  We did this primarily because of complaints of dizziness.  He went to the emergency room in January because of  chest pain.  While there, he complained about difficulty walking and dizziness.  He did call me and try to get in in January, but no appointments were available (he canceled or missed 3 appointments with me in December).  Pt denies dizziness today and reports that dizziness hasn't been a big issue, despite what had been reported.  States that his problem occurred when a home health aid told him to back down on his carbidopa/levodopa 25/100 CR from 3 po tid to 2 po  tid and he started not feeling well after that.  Reports that he was doing well before that.  States that she was telling him to go up and down on the dosages and he didn't feel well.  He actually stopped it for a short period of time.  Once he got back up on carbidopa/levodopa 25/100 CR 3 po tid he felt much better and denies any real sense of dizziness.  He is exercising faithfully and thinks that has been really helpful.  Works out with trainer 2 days a week and does other exercises himself every other day of the week.  No falls.    03/19/17 update:  Patient is on carbidopa/levodopa 25/100 CR, 3 tablets at 9 AM/3 tablets at noon and 3 tablets at 3 PM.  He is also on carbidopa/levodopa 50/200 at bedtime.  He called me earlier in the month to state that he was having nausea that he associated with levodopa.  I thought that this was strange given that he has been on levodopa for many years but he insisted that it was from the medicine.  He was given a short-term prescription for Zofran.  He states that this helped.  No falls since our last visit.  "I feel pretty good."  Some back pain if sits a while.  Walking for exercise.  PT just ended this week and "it helped a lot."  On gabapentin for sleep and that "makes me drowsy."    10/23/17 update: Patient is seen in follow-up for Parkinson's disease.  I have not seen him since June.  He is on carbidopa/levodopa 25/100 CR, 3 tablets 3 times per day and carbidopa/levodopa 50/200 at bedtime.  He was admitted to  the hospital in August for atypical chest pain.  While in the hospital, he was found to have a low B12.  He wanted to treat this with oral medication although he was given an IM injection in the hospital.  B12 level was only 62.  It was checked after that in august and was in the 500's.  Patient states that he is taking oral B12 daily.  He doesn't know the dosage.  He is exercising - he is doing sit ups.  Was doing PT and it "ran out" 2 weeks ago.   Having some headaches in the AM.  Described as pressure in the frontal region for 30 min after awakening.    03/25/28 update: Patient is seen today in follow-up for Parkinson's disease.  The patient is on carbidopa/levodopa 25/100 CR, 3 tablets 3 times per day and carbidopa/levodopa 50/200 at bedtime.  Pt denies falls.  He is riding his bike faithfully every AM.  Pt denies lightheadedness, near syncope.  He will occasionally have a hallucination at bedtime of adults in the room.  They are sometimes are scary.  Happens 1-2 times per week.    Mood has been good.  Recheck of his B12 with oral supplementation was good.  12/14/18 update: Patient is seen today in follow-up for Parkinson's disease.  I have not seen him since June due to a prior no show (and showed up 45 min late to appt today).  Patient is supposed to be on carbidopa/levodopa 25/100 CR, 3 tablets at 9 AM/noon/3 PM but is actually taking at 7am (but doesn't get out of bed for the day until 9-10am)/2pm/8pm.  He is on carbidopa/levodopa 50/200 at bedtime (10pm).  No hallucinations during the day.  Has some at night but "while I'm sleeping and sometimes  they wake me up."  C/o headache but upon probing its not headache but dizziness when he gets up and gets better when he sits.    06/21/19 update: Patient seen today in follow-up for Parkinson's disease.  Last visit, I told him to change his timing of his carbidopa/levodopa 25/100 CR so he was taking 3 tablets at 9 AM, 3 tablets at noon and 3 tablets at 3 PM,  while continuing to take carbidopa/levodopa 50/200 at bedtime.  He reports that ***.  Last visit, I was very concerned about the patient living independently and called [place], where he lives.  I was told that he was managing quite well and that they had "minimal concerns about him."  They did tell me that the patient was still driving some.  I was very surprised about that, as the patient usually takes a cab to our office.  I expressed my concerns that the patient should not be driving.  Not long after I was expressing my concerns about the patient living independently (about 2 months), I got a call from our answering service that the patient called and stated that he got up and was not able to put his pants on because he was having trouble standing and also felt lightheaded.  The answering service advised the patient to call 911, but he stated that he would call his knees.  They could not call him back because he did not know his own phone number.  When we got the message, I told him to call [facility] and have somebody check on him.  EMS also went out to check on him.  I requested lab work.  [place] states that they were unable to do this since the patient lived independently and also stated that "all the staff are busy" and it would be difficult for them to assist him with a video visit and they had no way to transport him at the time (due to COVID).  Unfortunately, without family, without transportation, without his ability to do an e- visit or labs, I was not sure what I could do for him.  I thought perhaps he just had a urinary tract infection, but ultimately we were able to get [place] to agree to try to get a primary care visit.  I did not hear back from them after that, although the patient called me back about 10 days later stating that he was having swelling in his ankles and he was told to contact his primary care as this was out of my realm of expertise.  He was in the  emergency room on June 7 after a fall.  I reviewed those records.  Those records indicate that the folks from [place] were with him and stated that he was often confused and hallucinating and that the patient's family was having him move with them to [place] the following day.  Neuroimaging has previously been performed.  It is not available for my review today.  PREVIOUS MEDICATIONS: Sinemet  ALLERGIES:  No Known Allergies  CURRENT MEDICATIONS:  Outpatient Encounter Medications as of 06/21/2019  Medication Sig   amLODipine (NORVASC) 2.5 MG tablet Take 2 tablets (5 mg total) by mouth daily. (Patient taking differently: Take 2.5 mg by mouth daily. )   aspirin EC 81 MG tablet Take 81 mg by mouth daily.   atorvastatin (LIPITOR) 20 MG tablet Take 20 mg by mouth at bedtime.    B-12, Methylcobalamin, 1000 MCG SUBL Place 1  tablet under the tongue daily.   carbidopa-levodopa (SINEMET CR) 50-200 MG tablet Take 1 tablet by mouth at bedtime.   Carbidopa-Levodopa ER (SINEMET CR) 25-100 MG tablet controlled release 3 tablets at 9 AM/ 3 tablets at noon/and 3 tablets at 3 PM.   gabapentin (NEURONTIN) 300 MG capsule Take 600 mg by mouth at bedtime.   nitroGLYCERIN (NITROSTAT) 0.4 MG SL tablet Place 1 tablet (0.4 mg total) under the tongue every 5 (five) minutes as needed for chest pain.   quinapril (ACCUPRIL) 20 MG tablet Take 10 mg by mouth at bedtime.    sitaGLIPtin (JANUVIA) 50 MG tablet Take 50 mg by mouth daily.   No facility-administered encounter medications on file as of 06/21/2019.     PAST MEDICAL HISTORY:   Past Medical History:  Diagnosis Date   CAD (coronary artery disease)    Stent in [place]; First diagonal   Chest pain 04/2017   Diabetes (HCC)    Hyperlipidemia    Hypertension    Parkinson's disease (HCC)     PAST SURGICAL HISTORY:   Past Surgical History:  Procedure Laterality Date   CORONARY ANGIOPLASTY WITH STENT PLACEMENT      SOCIAL HISTORY:     Social History   Socioeconomic History   Marital status: Single    Spouse name: Not on file   Number of children: Not on file   Years of education: Not on file   Highest education level: Not on file  Occupational History   Not on file  Social Needs   Financial resource strain: Not on file   Food insecurity    Worry: Not on file    Inability: Not on file   Transportation needs    Medical: Not on file    Non-medical: Not on file  Tobacco Use   Smoking status: Never Smoker   Smokeless tobacco: Never Used  Substance and Sexual Activity   Alcohol use: No    Alcohol/week: 0.0 standard drinks   Drug use: No   Sexual activity: Not on file  Lifestyle   Physical activity    Days per week: Not on file    Minutes per session: Not on file   Stress: Not on file  Relationships   Social connections    Talks on phone: Not on file    Gets together: Not on file    Attends religious service: Not on file    Active member of club or organization: Not on file    Attends meetings of clubs or organizations: Not on file    Relationship status: Not on file   Intimate partner violence    Fear of current or ex partner: Not on file    Emotionally abused: Not on file    Physically abused: Not on file    Forced sexual activity: Not on file  Other Topics Concern   Not on file  Social History Narrative   Not on file    FAMILY HISTORY:   Family Status  Relation Name Status   Mother  Deceased       alzheimer's   Father  Deceased       HTN   Sister  Deceased       x3   MGM  Deceased   MGF  Deceased   PGM  Deceased   PGF  Deceased    ROS: ROS  PHYSICAL EXAMINATION:    VITALS:   There were no vitals filed for this visit. No data  found.   GEN:  The patient appears stated age and is in NAD. HEENT:  Normocephalic, atraumatic.  The mucous membranes are moist. The superficial temporal arteries are without ropiness or tenderness. CV:  RRR Lungs:   CTAB Neck/HEME:  There are no carotid bruits bilaterally.  Neurological examination:  Orientation:  Montreal Cognitive Assessment  12/14/2018 10/30/2016 06/28/2015  Visuospatial/ Executive (0/5) 1 0 1  Naming (0/3) 2 2 2   Attention: Read list of digits (0/2) 2 2 1   Attention: Read list of letters (0/1) 1 1 1   Attention: Serial 7 subtraction starting at 100 (0/3) 3 3 3   Language: Repeat phrase (0/2) 1 2 2   Language : Fluency (0/1) 0 1 1  Abstraction (0/2) 1 2 2   Delayed Recall (0/5) 0 2 0  Orientation (0/6) 4 6 5   Total 15 21 18   Adjusted Score (based on education) 16 22 19      Cranial nerves: There is good facial symmetry.  He is hypophonic.  The speech is fluent and clear. Soft palate rises symmetrically and there is no tongue deviation. Hearing is intact to conversational tone. Sensation: Sensation is intact to light touch throughout Motor: Strength is 5/5 in the bilateral upper and lower extremities.   Shoulder shrug is equal and symmetric.  There is no pronator drift.  Movement examination: Tone: There is mild to mod increased tone in the bilateral UE (but hasn't taken med since early AM and seen mid afternoon) Abnormal movements: No tremor or dyskinesia today. Coordination:  There is  decremation with RAM's, with any form of RAMS, including alternating supination and pronation of the forearm, hand opening and closing, finger taps, heel taps and toe taps bilaterally but it is mild Gait and Station: The patient pushes off of the chair.  He is short stepped.  He turns en bloc.   LABS:   Lab Results  Component Value Date   VITAMINB12 719 10/23/2017   labs: Lab work was received from the patient's primary care physician dated March 18, 2018.  White blood cells were 3.9, hemoglobin 13.0, hematocrit 40.6 and platelets 200.  Sodium was 142, potassium 3.5, chloride 105, CO2 31, BUN 14 and creatinine 0.91.  Glucose was 110.  TSH 1.98.  ASSESSMENT/PLAN:  1.  Parkinsonism.  I suspect  that this does represent idiopathic Parkinson's disease.  The patient has tremor, bradykinesia, rigidity and postural instability.  -continue carbidopa/levodopa 25/100 CR, but again told to change the timing of the medication to 3 tablets at 9 AM/noon/3 PM.    He will continue carbidopa/levodopa 50/200 at bedtime.   -using zofran prn nausea 2.  Parkinson's disease dementia with Parkinson hallucinations  -I remain concerned about his living situation, that being living independently.  I have addressed this with his living facility previously, but this did not seem to get anywhere.  I am going to go ahead and schedule him for neurocognitive testing to see what that shows and see if we can have any supportive data.  -denies hallucinations today. 3.  Constipation  -This is frequently associated with Parkinson's disease.  Has rancho recipe 4.  Orthostatic hypotension  -he frequently comes in complaining about headache, but when probed, it is not headache, but rather dizziness, likely associated with orthostasis.  He is on 2 different blood pressure medications, but blood pressure remains borderline high today.  Unsure if he is taking his medications properly at home, as he takes them on his own.  Suspect that he gets mild orthostasis  when he stands.  Encouraged him to liberalize water intake, which he admits that he really is not doing because of frequency of urination. 5.  Diabetic peripheral neuropathy  -Safety was discussed.  -The patient was on gabapentin 300 mg - 2 po q hs.  Using primarily for sleep. Dropped last visit to 300 mg at night.  Unclear what dose he is taking right now 6.  Hx of severe B12 deficiency  -Level was only 62 while in the hospital in August, 2018. much improved now on oral supplementation 7.  I will plan on seeing the patient back in the next 6 months.  He and I talked about the importance of compliance with visits.

## 2019-12-17 IMAGING — CR THORACIC SPINE 2 VIEWS
4 series · 4 of 4 positions shown · non-contrast
Comparison: Cervical spine CT 03/06/2019 and chest CT 05/10/2017

CLINICAL DATA: Fall.

EXAM:
THORACIC SPINE 2 VIEWS

[t-spine ap]
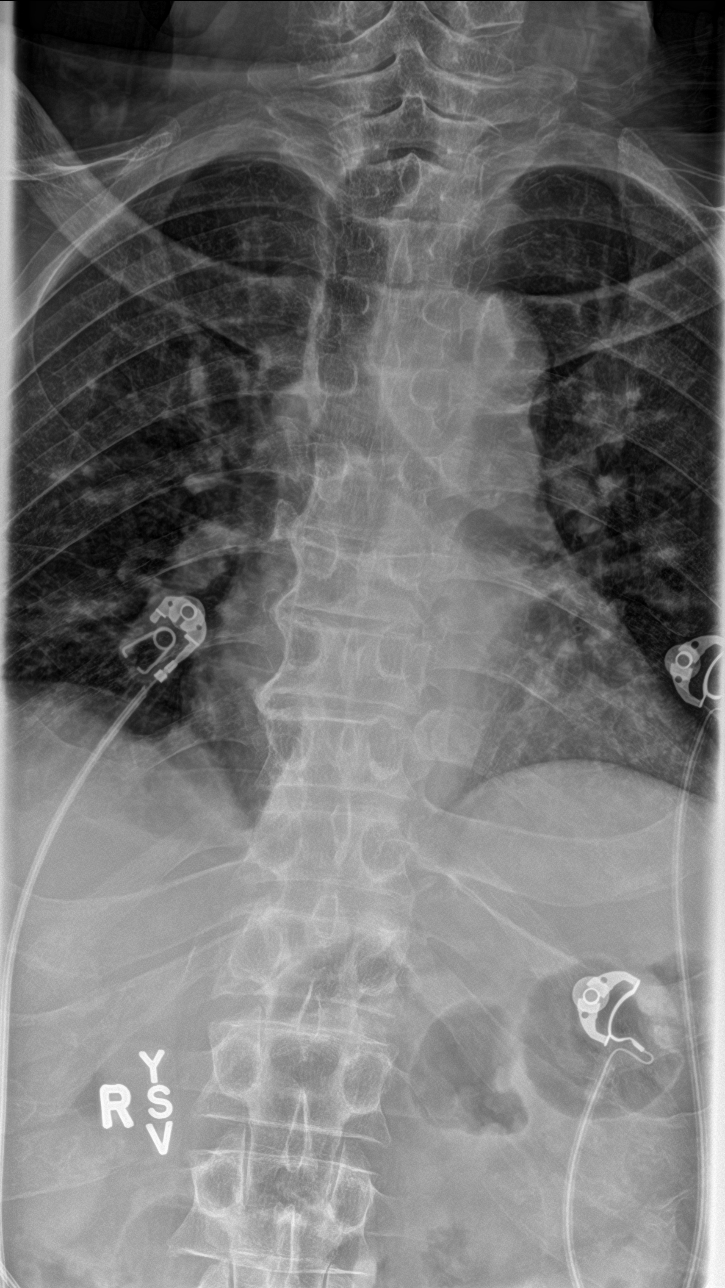

[t-spine lat (1 of 2)]
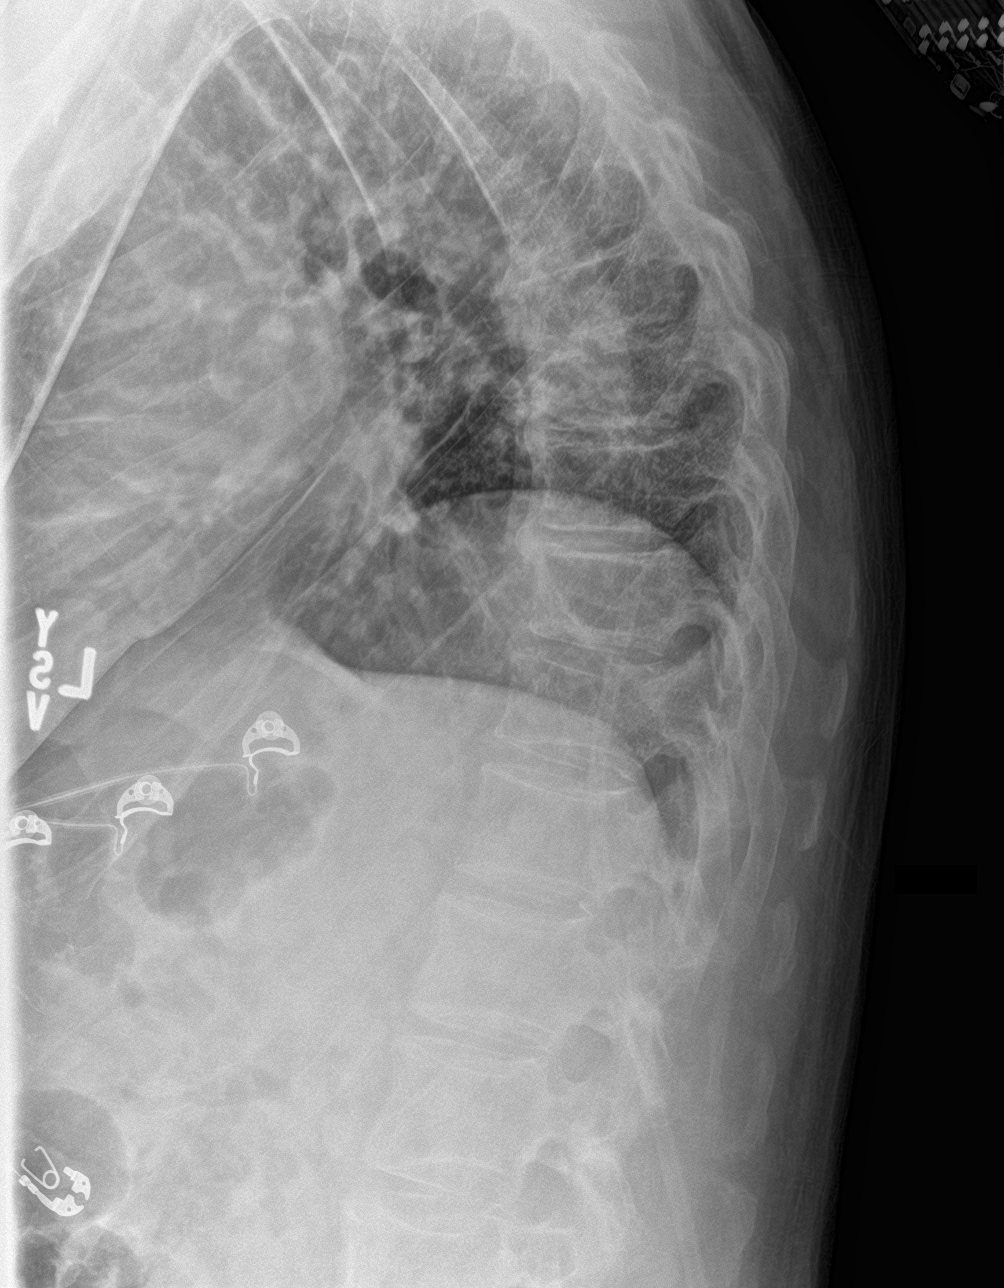

[t-spine swimmers]
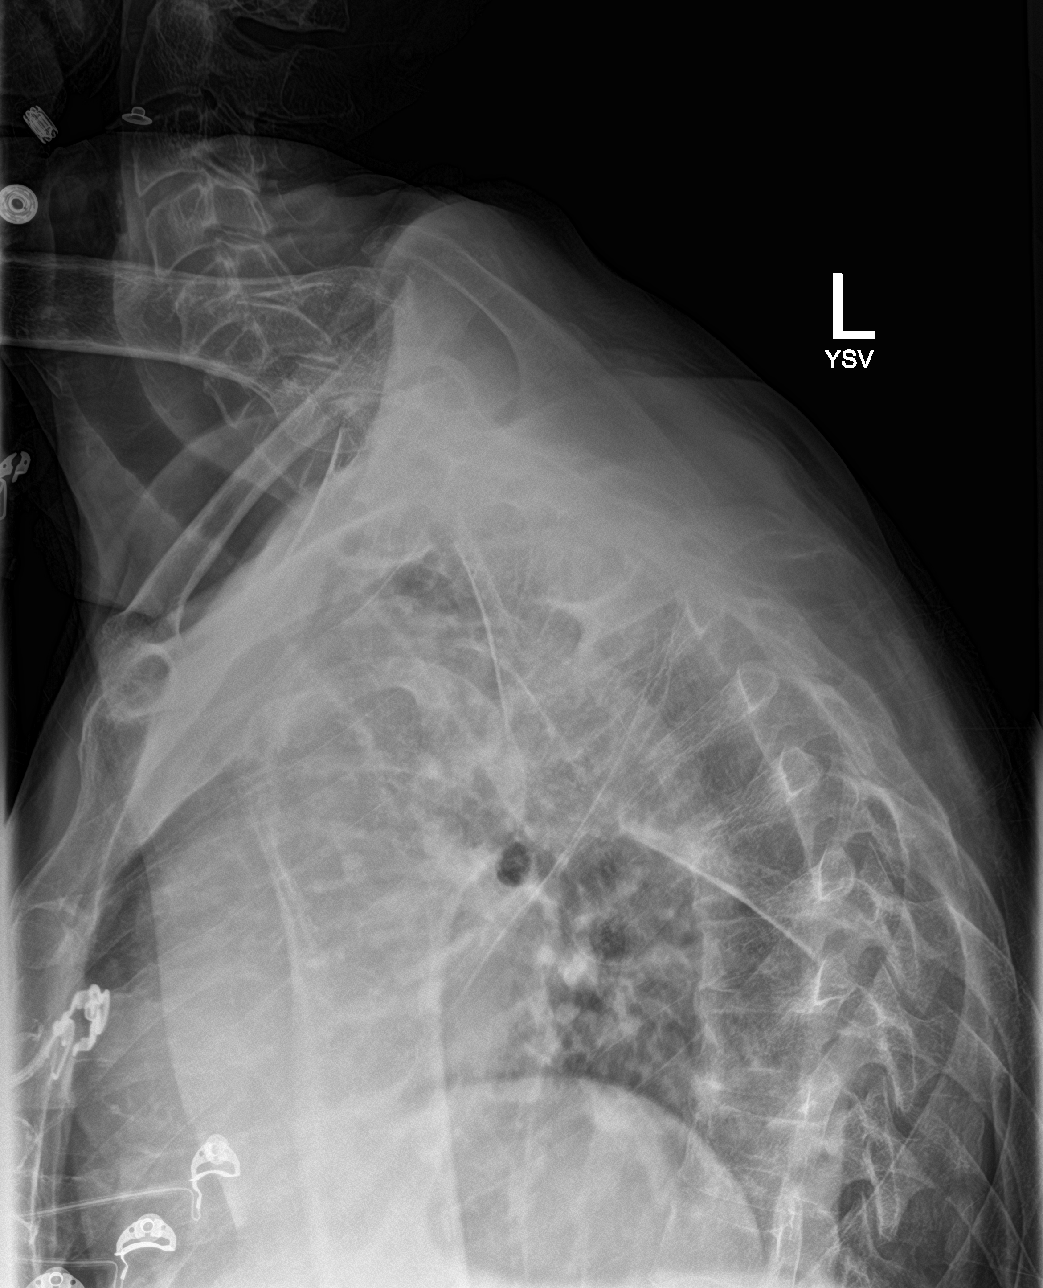

[t-spine lat (2 of 2)]
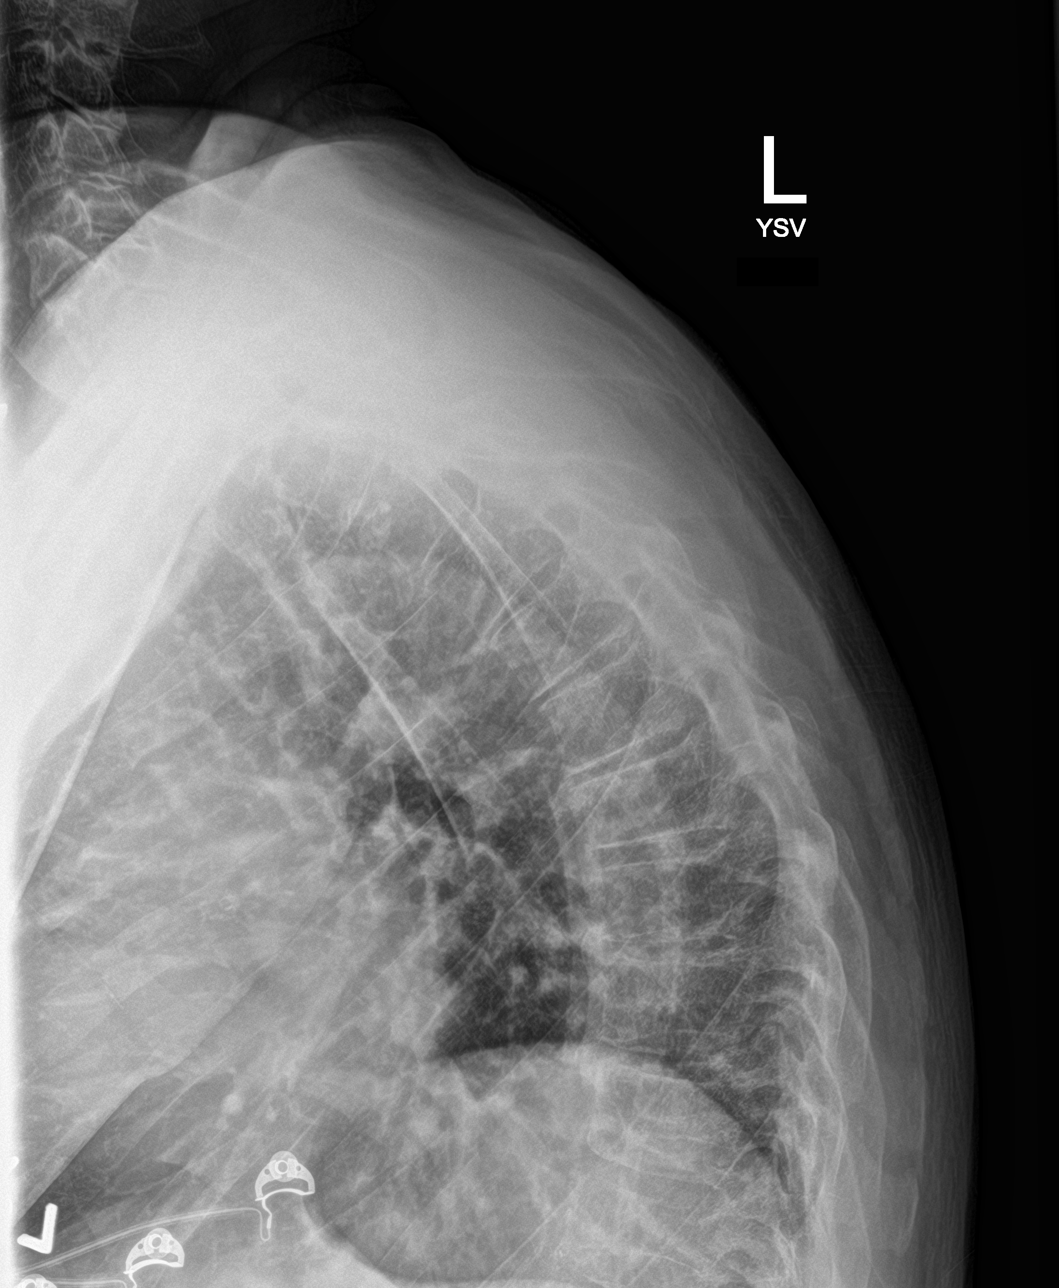

[4 of 4 positions shown; findings below may reference images not displayed]

FINDINGS: Kyphosis in the upper thoracic spine. Mild vertebral body height
loss in the midthoracic spine is similar to the prior chest CT and
likely degenerative in etiology. No evidence for an acute
compression deformity. Normal alignment at the thoracolumbar
junction. Normal alignment at the cervicothoracic junction although
this is better characterized on the recent cervical spine CT.
IMPRESSION: No acute abnormality to the thoracic spine.

## 2019-12-17 IMAGING — CT CT CERVICAL SPINE WITHOUT CONTRAST
4 of 8 series · 13 of 33 positions shown, 14 images · non-contrast
Comparison: None.

CLINICAL DATA: Posttraumatic headache.  Found on floor.

EXAM:
CT HEAD WITHOUT CONTRAST
CT CERVICAL SPINE WITHOUT CONTRAST
TECHNIQUE: Multidetector CT imaging of the head and cervical spine was
performed following the standard protocol without intravenous
contrast. Multiplanar CT image reconstructions of the cervical spine
were also generated.

[Series 5: head bone · axial · 0.45mm/px · z∈[-139,-57]mm · 3 of 83 slices shown]
[im 21/83  bone]
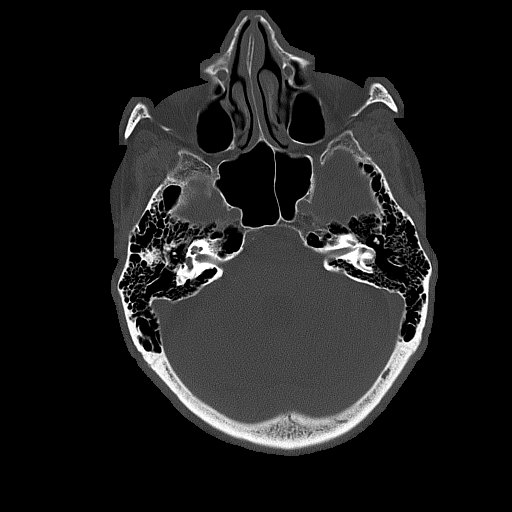
[im 42/83  bone]
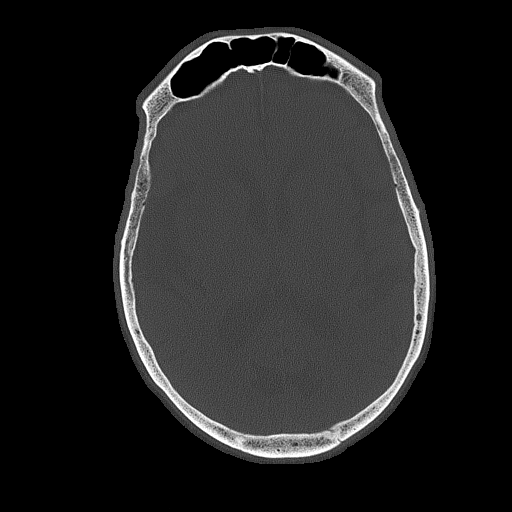
[im 62/83  bone]
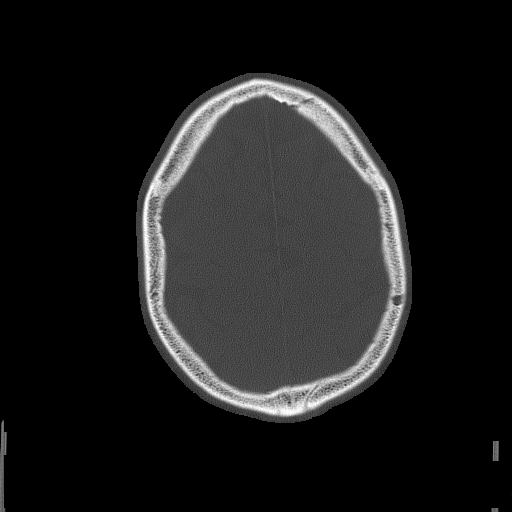

[Series 8: c_spine 2.0 st · axial · 0.53mm/px · z∈[-292,-176]mm · 4 of 98 slices shown, 5 images]
[im 20/98  soft-tissue]
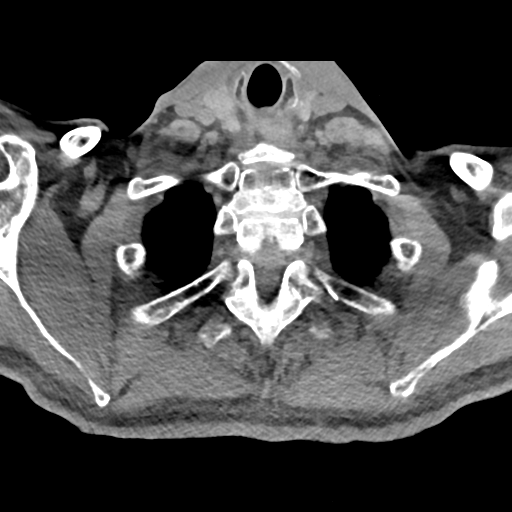
[im 20/98  bone]
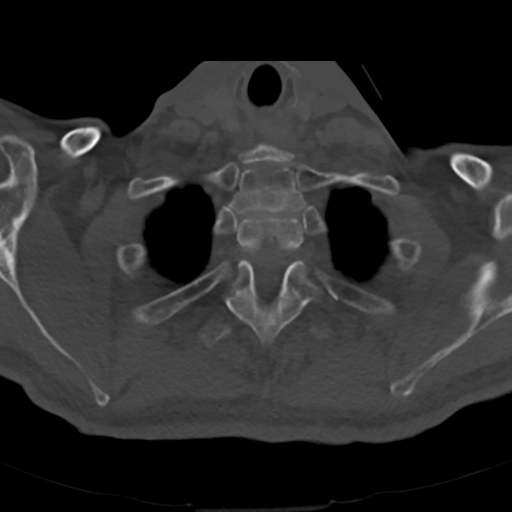
[im 39/98  bone]
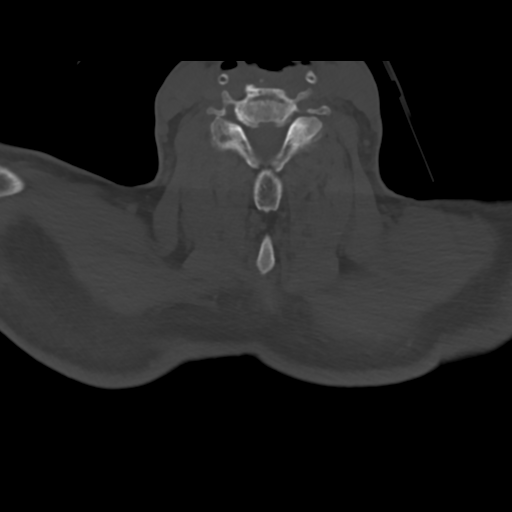
[im 59/98  bone]
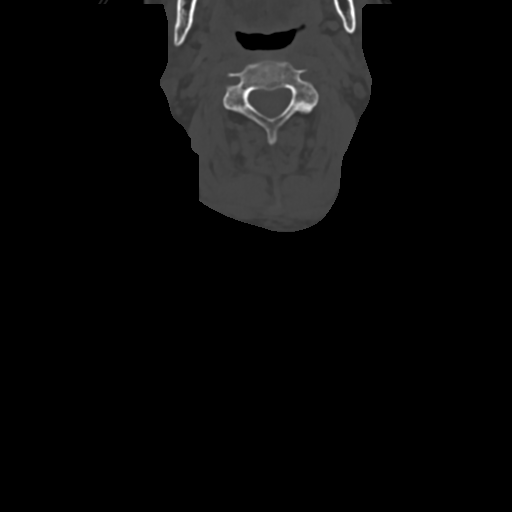
[im 78/98  bone]
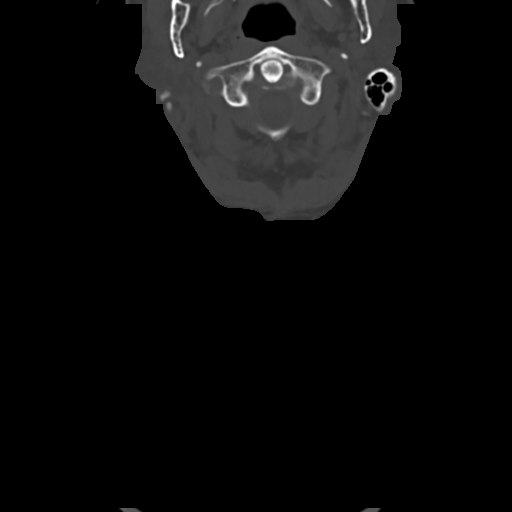

[Series 10: c_spine 2.0 sag bone · sagittal · 0.40mm/px · 5 of 62 slices shown]
[im 11/62  bone]
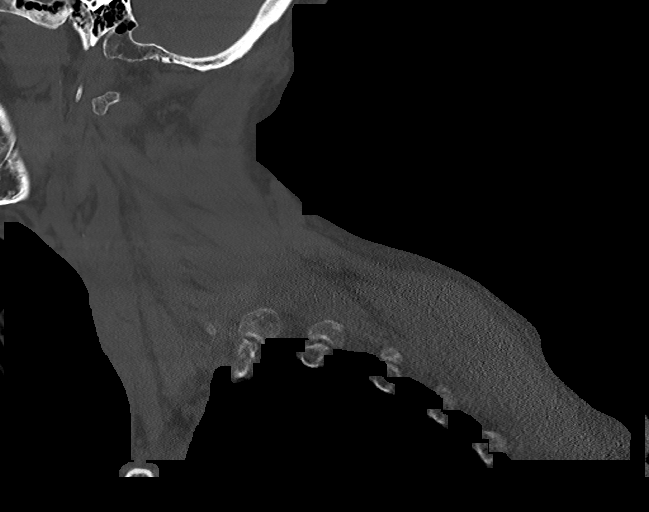
[im 21/62  bone]
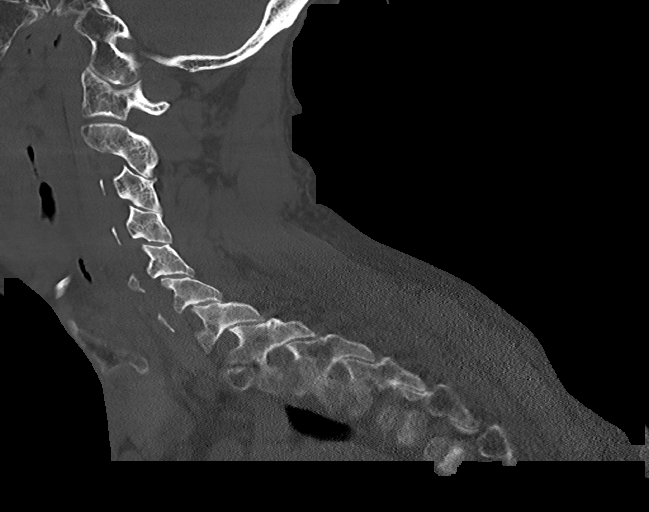
[im 31/62  bone]
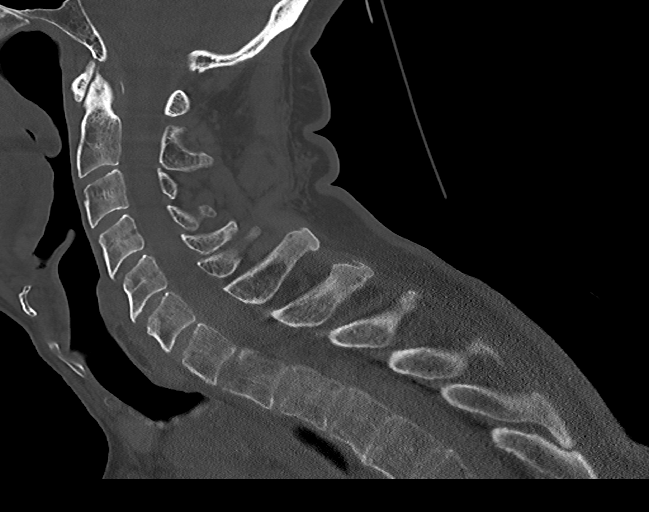
[im 41/62  bone]
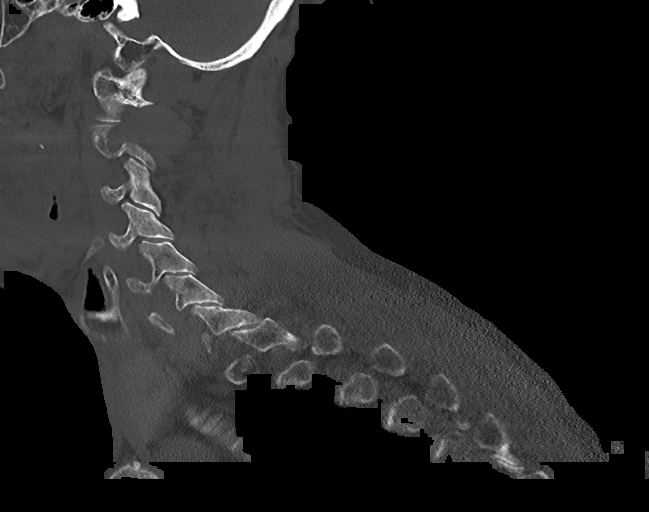
[im 51/62  bone]
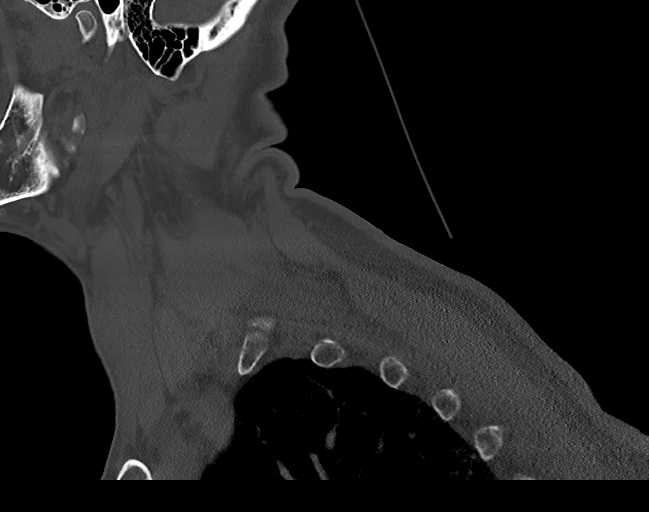

[Series 11: c_spine 2.0 cor bone · coronal · 0.29mm/px · 1 of 121 slices shown]
[im 61/121  bone]
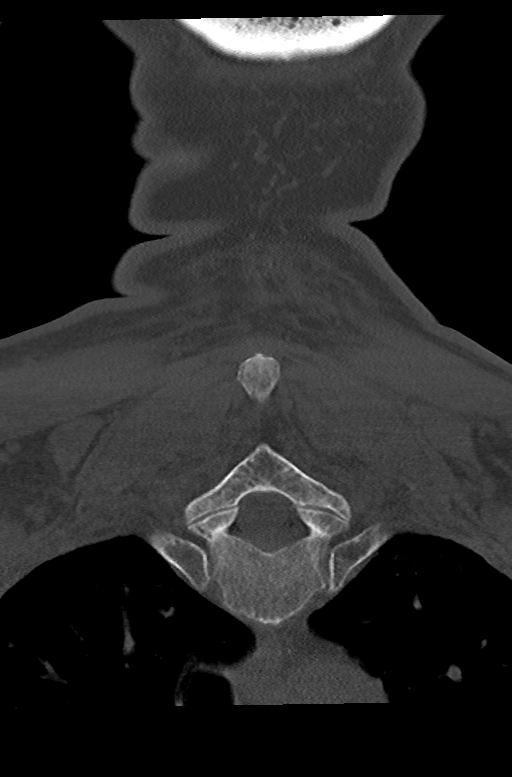

[13 of 33 positions shown; findings below may reference images not displayed]

FINDINGS: CT HEAD FINDINGS

Brain: Cerebral atrophy. There is extensive low density throughout
the subcortical and periventricular white matter. Patchy low density
in the right insular cortex. Negative for acute hemorrhage. Negative
for hydronephrosis, midline shift, mass lesion or large acute
infarct.

Vascular: No hyperdense vessel or unexpected calcification.

Skull: Negative for calvarial fracture. There is prominent lucency
involving the left maxilla which could be related to odontogenic
periapical lucency.

Sinuses/Orbits: Visualized paranasal sinuses are well aerated with
mild mucosal disease right maxillary sinus.

Other: None

CT CERVICAL SPINE FINDINGS

Alignment: Normal alignment in the cervical spine. Kyphosis in the
upper thoracic spine.

Skull base and vertebrae: No acute fracture. No primary bone lesion
or focal pathologic process. Irregularity along the right side of
the superior endplate of C6 is likely degenerative in etiology. No
clear evidence for compression fracture at this level.

Soft tissues and spinal canal: No prevertebral fluid or swelling. No
visible canal hematoma.

Disc levels:  Disc spaces are maintained in the cervical spine.

Upper chest: Negative.

Other: None
IMPRESSION: 1. No acute intracranial abnormality.
2. Cerebral atrophy with extensive low density throughout the white
matter. Findings are probably associated with chronic ischemic
changes.
3. No acute bone abnormality in the cervical spine.
4. Question periapical lucency involving the left maxilla that could
be related to underlying dental disease.

## 2020-03-29 DEATH — deceased

## 2020-08-09 ENCOUNTER — Encounter: Payer: Self-pay | Admitting: General Practice

## 2020-08-09 ENCOUNTER — Telehealth: Payer: Self-pay | Admitting: Cardiology

## 2020-08-09 NOTE — Telephone Encounter (Signed)
  Recall expunge letter sent
